# Patient Record
Sex: Male | Born: 1935 | Race: White | Hispanic: No | Smoking: Former smoker
Health system: Southern US, Community
[De-identification: ages and names within clinical notes are randomized; demographics above are authoritative.]

## PROBLEM LIST (undated history)

## (undated) DIAGNOSIS — M1990 Unspecified osteoarthritis, unspecified site: Secondary | ICD-10-CM

## (undated) DIAGNOSIS — F0390 Unspecified dementia without behavioral disturbance: Secondary | ICD-10-CM

## (undated) DIAGNOSIS — I10 Essential (primary) hypertension: Secondary | ICD-10-CM

## (undated) DIAGNOSIS — IMO0002 Reserved for concepts with insufficient information to code with codable children: Secondary | ICD-10-CM

## (undated) DIAGNOSIS — F329 Major depressive disorder, single episode, unspecified: Secondary | ICD-10-CM

## (undated) DIAGNOSIS — N400 Enlarged prostate without lower urinary tract symptoms: Secondary | ICD-10-CM

## (undated) DIAGNOSIS — T07XXXA Unspecified multiple injuries, initial encounter: Secondary | ICD-10-CM

## (undated) HISTORY — PX: OTHER SURGICAL HISTORY: SHX169

---

## 2003-03-20 ENCOUNTER — Ambulatory Visit (HOSPITAL_COMMUNITY): Admission: RE | Admit: 2003-03-20 | Discharge: 2003-03-20 | Payer: Self-pay | Admitting: Ophthalmology

## 2006-11-05 ENCOUNTER — Inpatient Hospital Stay (HOSPITAL_COMMUNITY): Admission: EM | Admit: 2006-11-05 | Discharge: 2006-11-11 | Payer: Self-pay | Admitting: Emergency Medicine

## 2006-11-06 ENCOUNTER — Encounter (INDEPENDENT_AMBULATORY_CARE_PROVIDER_SITE_OTHER): Payer: Self-pay | Admitting: Specialist

## 2006-11-06 ENCOUNTER — Ambulatory Visit: Payer: Self-pay | Admitting: Orthopedic Surgery

## 2006-11-11 ENCOUNTER — Inpatient Hospital Stay: Admission: AD | Admit: 2006-11-11 | Discharge: 2006-12-10 | Payer: Self-pay | Admitting: Family Medicine

## 2006-11-24 ENCOUNTER — Ambulatory Visit: Payer: Self-pay | Admitting: Family Medicine

## 2006-11-27 ENCOUNTER — Ambulatory Visit (HOSPITAL_COMMUNITY): Admission: RE | Admit: 2006-11-27 | Discharge: 2006-11-27 | Payer: Self-pay | Admitting: Family Medicine

## 2006-12-10 ENCOUNTER — Ambulatory Visit: Payer: Self-pay | Admitting: Orthopedic Surgery

## 2007-02-11 ENCOUNTER — Ambulatory Visit: Payer: Self-pay | Admitting: Orthopedic Surgery

## 2007-07-07 ENCOUNTER — Emergency Department (HOSPITAL_COMMUNITY): Admission: EM | Admit: 2007-07-07 | Discharge: 2007-07-07 | Payer: Self-pay | Admitting: Emergency Medicine

## 2007-12-23 ENCOUNTER — Encounter: Payer: Self-pay | Admitting: Family Medicine

## 2008-01-25 ENCOUNTER — Ambulatory Visit: Payer: Self-pay | Admitting: Orthopedic Surgery

## 2008-01-25 ENCOUNTER — Inpatient Hospital Stay (HOSPITAL_COMMUNITY): Admission: EM | Admit: 2008-01-25 | Discharge: 2008-01-27 | Payer: Self-pay | Admitting: Emergency Medicine

## 2008-01-26 ENCOUNTER — Encounter: Payer: Self-pay | Admitting: Orthopedic Surgery

## 2008-01-27 ENCOUNTER — Encounter: Payer: Self-pay | Admitting: Orthopedic Surgery

## 2008-02-02 ENCOUNTER — Telehealth: Payer: Self-pay | Admitting: Orthopedic Surgery

## 2008-02-02 ENCOUNTER — Encounter: Payer: Self-pay | Admitting: Orthopedic Surgery

## 2008-04-07 ENCOUNTER — Ambulatory Visit: Payer: Self-pay | Admitting: Orthopedic Surgery

## 2008-04-07 ENCOUNTER — Inpatient Hospital Stay (HOSPITAL_COMMUNITY): Admission: EM | Admit: 2008-04-07 | Discharge: 2008-04-13 | Payer: Self-pay | Admitting: Emergency Medicine

## 2008-04-08 ENCOUNTER — Encounter: Payer: Self-pay | Admitting: Orthopedic Surgery

## 2008-04-09 ENCOUNTER — Encounter: Payer: Self-pay | Admitting: Orthopedic Surgery

## 2008-04-13 ENCOUNTER — Encounter: Payer: Self-pay | Admitting: Orthopedic Surgery

## 2008-04-13 ENCOUNTER — Inpatient Hospital Stay: Admission: AD | Admit: 2008-04-13 | Discharge: 2008-05-19 | Payer: Self-pay | Admitting: Internal Medicine

## 2008-05-10 ENCOUNTER — Ambulatory Visit: Payer: Self-pay | Admitting: Orthopedic Surgery

## 2008-05-10 DIAGNOSIS — S72143A Displaced intertrochanteric fracture of unspecified femur, initial encounter for closed fracture: Secondary | ICD-10-CM

## 2008-07-17 ENCOUNTER — Ambulatory Visit: Payer: Self-pay | Admitting: Orthopedic Surgery

## 2008-07-17 DIAGNOSIS — M76899 Other specified enthesopathies of unspecified lower limb, excluding foot: Secondary | ICD-10-CM

## 2009-02-08 IMAGING — CR DG HIP (WITH OR WITHOUT PELVIS) 2-3V*L*
3 series · 3 of 3 positions shown · non-contrast
Comparison: None.

CLINICAL DATA: 89-year-old male with fall.

LEFT HIP - COMPLETE 2+ VIEW

[view not recorded (1 of 3)]
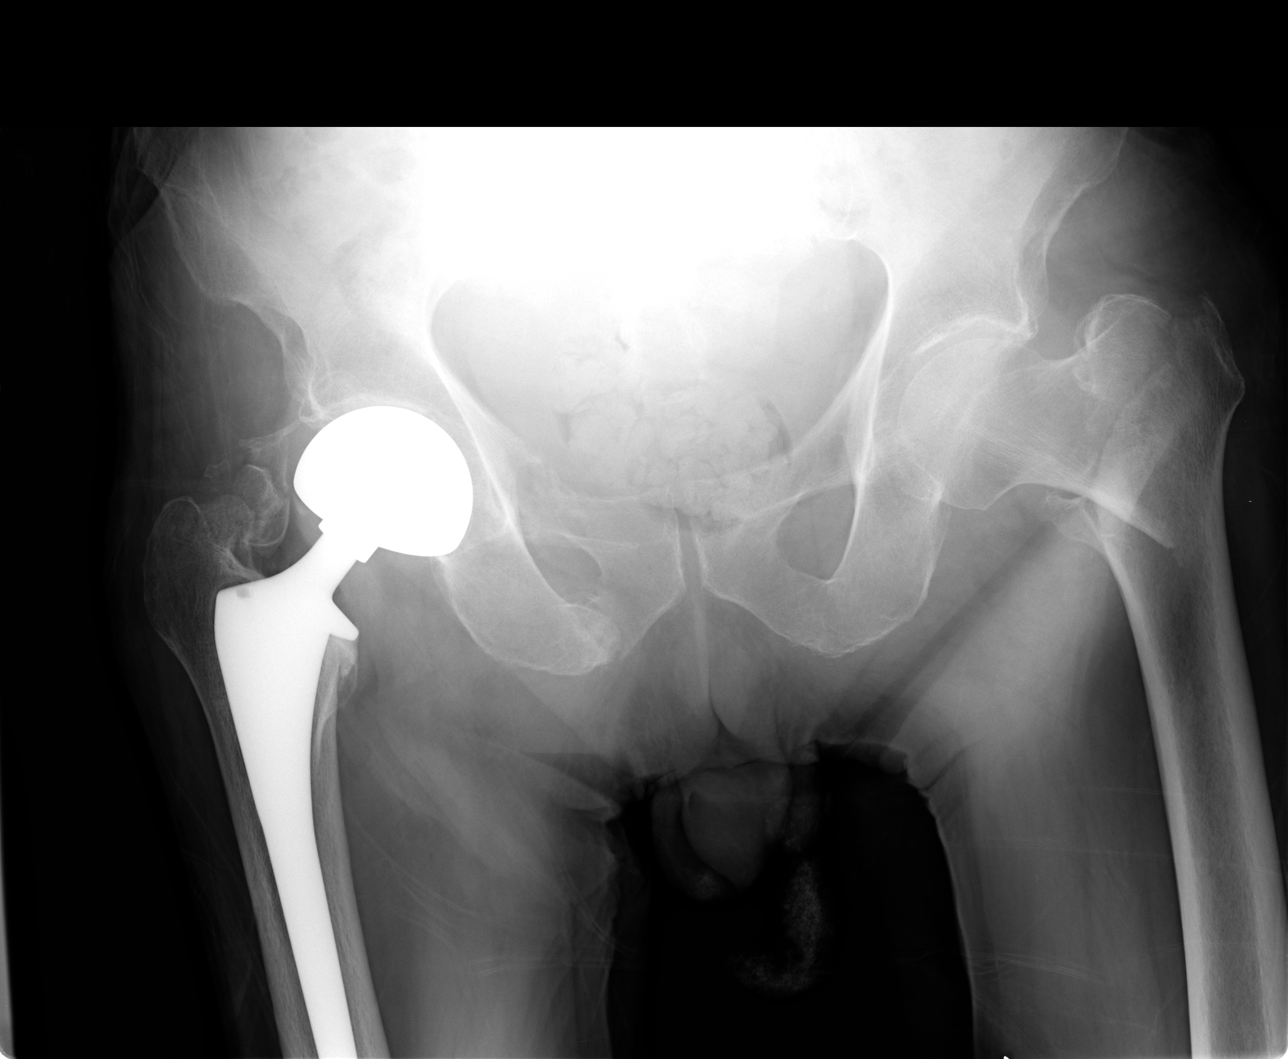

[view not recorded (2 of 3)]
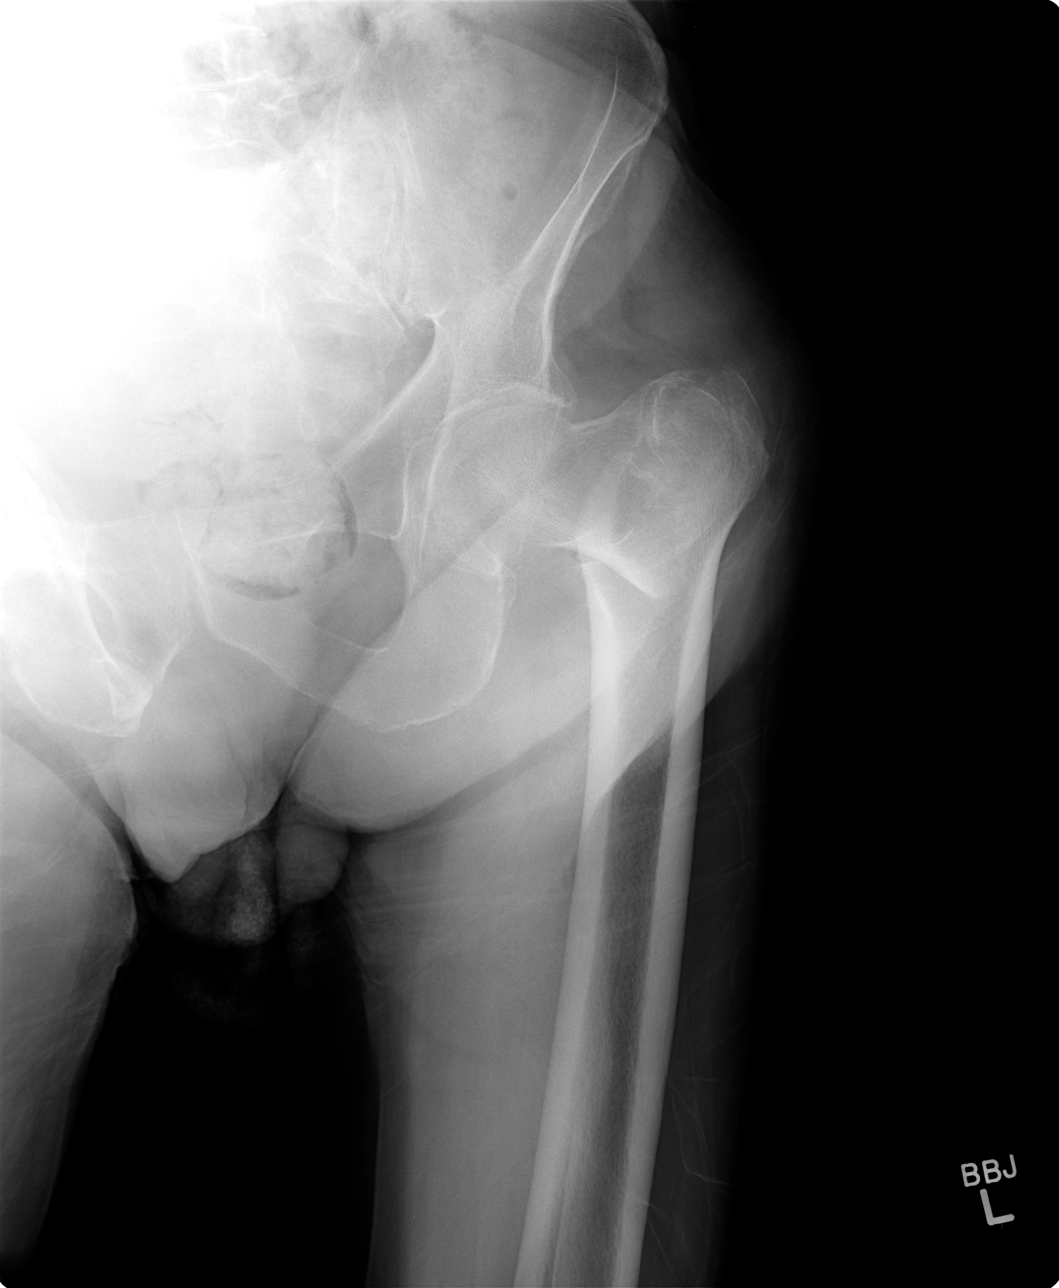

[view not recorded (3 of 3)]
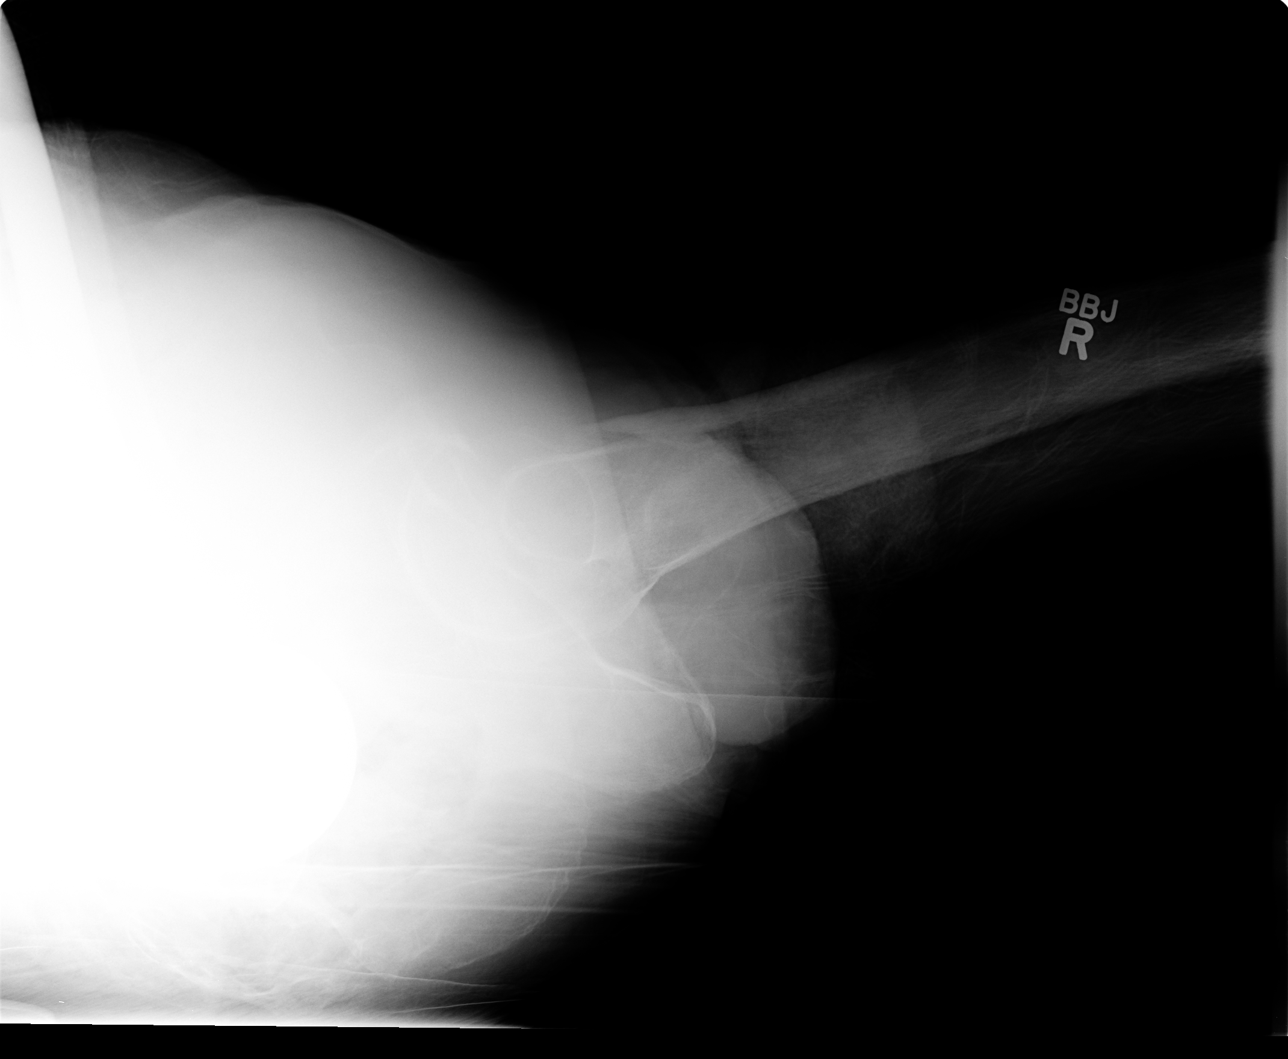

[3 of 3 positions shown; findings below may reference images not displayed]

FINDINGS: Sequelae of right femoral arthroplasty.  Heterotopic
ossification about the right greater trochanter.

Comminuted fracture of the proximal left femur, intertrochanteric.
Varus impaction.  The lesser trochanter might constitute a separate
comminution fragments and is proximally displaced. Mild anterior
displacement of the distal fragment on cross-table lateral view.

No associated fracture of the left hemi pelvis identified.   There
is irregularity of the right superior and inferior pubic rami
compatible with mildly displaced age indeterminate fractures.
These may be chronic given the postoperative changes to the
proximal right femur.
IMPRESSION: 1.  Comminuted intertrochanteric left femoral fracture with varus
impaction and mild anterior displacement.  The lesser trochanter
might constitute a separate comminution fragment.
2.  Age indeterminate but favor chronic right superior inferior
pubic rami fractures.

## 2009-02-08 IMAGING — CR DG CHEST 1V
1 series · 1 of 1 positions shown · non-contrast
Comparison: 11/05/2006

CLINICAL DATA: 89-year-old male with fall, preoperative exam for
left hip fracture repair.

CHEST - 1 VIEW

[view not recorded]
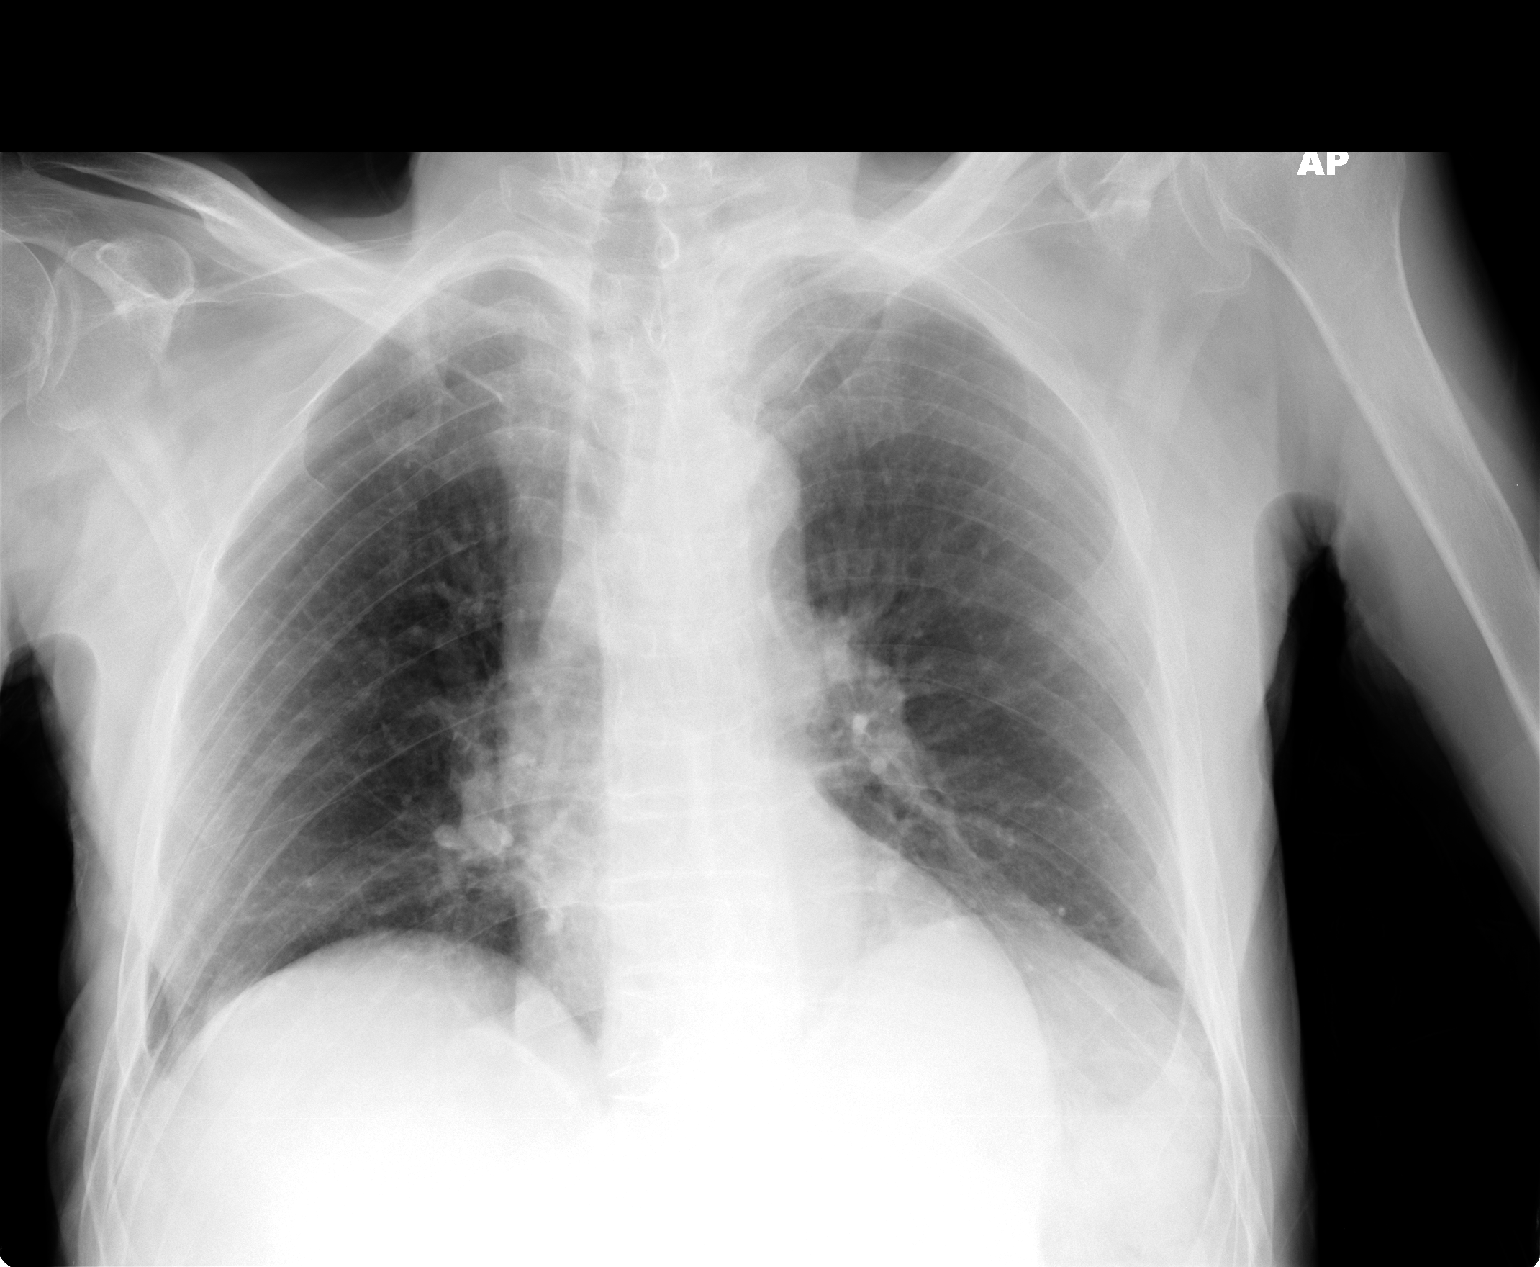

[1 of 1 positions shown; findings below may reference images not displayed]

FINDINGS: Shallow lower lung volumes, but stable cardiac size
mediastinal contour.  No pneumothorax, pulmonary edema, pleural
effusion or focal airspace opacity.  Stable visualized osseous
structures.
IMPRESSION: Shallow lung volumes.  No acute cardiopulmonary abnormality.

## 2009-09-28 ENCOUNTER — Ambulatory Visit (HOSPITAL_COMMUNITY): Admission: RE | Admit: 2009-09-28 | Discharge: 2009-09-28 | Payer: Self-pay | Admitting: Family Medicine

## 2011-01-21 NOTE — Letter (Signed)
Summary: rma chart  rma chart   Imported By: Curtis Sites 08/09/2010 11:32:44  _____________________________________________________________________  External Attachment:    Type:   Image     Comment:   External Document

## 2011-02-02 ENCOUNTER — Emergency Department (HOSPITAL_COMMUNITY)
Admission: EM | Admit: 2011-02-02 | Discharge: 2011-02-02 | Disposition: A | Discharge status: Home / Self Care | Payer: Medicare Other | Attending: Emergency Medicine | Admitting: Emergency Medicine

## 2011-02-02 ENCOUNTER — Emergency Department (HOSPITAL_COMMUNITY): Payer: Medicare Other

## 2011-02-02 DIAGNOSIS — I10 Essential (primary) hypertension: Secondary | ICD-10-CM | POA: Insufficient documentation

## 2011-02-02 DIAGNOSIS — W1809XA Striking against other object with subsequent fall, initial encounter: Secondary | ICD-10-CM | POA: Insufficient documentation

## 2011-02-02 DIAGNOSIS — S0990XA Unspecified injury of head, initial encounter: Secondary | ICD-10-CM | POA: Insufficient documentation

## 2011-02-02 DIAGNOSIS — Y9210 Unspecified residential institution as the place of occurrence of the external cause: Secondary | ICD-10-CM | POA: Insufficient documentation

## 2011-02-02 DIAGNOSIS — N390 Urinary tract infection, site not specified: Secondary | ICD-10-CM | POA: Insufficient documentation

## 2011-02-02 DIAGNOSIS — F0390 Unspecified dementia without behavioral disturbance: Secondary | ICD-10-CM | POA: Insufficient documentation

## 2011-02-02 LAB — URINALYSIS, ROUTINE W REFLEX MICROSCOPIC
Bilirubin Urine: NEGATIVE
Hgb urine dipstick: NEGATIVE
Nitrite: NEGATIVE
Specific Gravity, Urine: >1.03 — ABNORMAL HIGH (ref 1.005–1.030)
Urine Glucose, Fasting: NEGATIVE mg/dL
pH: 5.5 (ref 5.0–8.0)

## 2011-02-02 LAB — URINE MICROSCOPIC-ADD ON

## 2011-02-04 LAB — URINE CULTURE: Culture  Setup Time: 201202131320

## 2011-03-19 ENCOUNTER — Emergency Department (HOSPITAL_COMMUNITY)
Admission: EM | Admit: 2011-03-19 | Discharge: 2011-03-19 | Disposition: A | Discharge status: Home / Self Care | Payer: Medicare Other | Attending: Emergency Medicine | Admitting: Emergency Medicine

## 2011-03-19 DIAGNOSIS — R300 Dysuria: Secondary | ICD-10-CM | POA: Insufficient documentation

## 2011-03-19 DIAGNOSIS — I10 Essential (primary) hypertension: Secondary | ICD-10-CM | POA: Insufficient documentation

## 2011-03-19 DIAGNOSIS — F0390 Unspecified dementia without behavioral disturbance: Secondary | ICD-10-CM | POA: Insufficient documentation

## 2011-03-19 DIAGNOSIS — M810 Age-related osteoporosis without current pathological fracture: Secondary | ICD-10-CM | POA: Insufficient documentation

## 2011-03-19 DIAGNOSIS — N390 Urinary tract infection, site not specified: Secondary | ICD-10-CM | POA: Insufficient documentation

## 2011-03-19 DIAGNOSIS — M1990 Unspecified osteoarthritis, unspecified site: Secondary | ICD-10-CM | POA: Insufficient documentation

## 2011-03-19 DIAGNOSIS — Z79899 Other long term (current) drug therapy: Secondary | ICD-10-CM | POA: Insufficient documentation

## 2011-03-19 LAB — BASIC METABOLIC PANEL
Calcium: 9.9 mg/dL (ref 8.4–10.5)
Chloride: 107 mEq/L (ref 96–112)
Creatinine, Ser: 1.09 mg/dL (ref 0.4–1.5)
GFR calc Af Amer: >60 mL/min (ref >60)

## 2011-03-19 LAB — CBC
Hemoglobin: 12 g/dL — ABNORMAL LOW (ref 13.0–17.0)
RBC: 3.95 MIL/uL — ABNORMAL LOW (ref 4.22–5.81)
WBC: 6.5 10*3/uL (ref 4.0–10.5)

## 2011-03-19 LAB — DIFFERENTIAL
Basophils Absolute: 0 10*3/uL (ref 0.0–0.1)
Basophils Relative: 1 % (ref 0–1)
Neutro Abs: 4.1 10*3/uL (ref 1.7–7.7)
Neutrophils Relative %: 62 % (ref 43–77)

## 2011-03-19 LAB — URINALYSIS, ROUTINE W REFLEX MICROSCOPIC
Glucose, UA: NEGATIVE mg/dL
Ketones, ur: NEGATIVE mg/dL
pH: 5 (ref 5.0–8.0)

## 2011-03-19 LAB — URINE MICROSCOPIC-ADD ON

## 2011-03-20 LAB — URINE CULTURE
Colony Count: NO GROWTH
Culture: NO GROWTH

## 2011-04-07 ENCOUNTER — Emergency Department (HOSPITAL_COMMUNITY): Payer: Medicare Other

## 2011-04-07 ENCOUNTER — Emergency Department (HOSPITAL_COMMUNITY)
Admission: EM | Admit: 2011-04-07 | Discharge: 2011-04-07 | Disposition: A | Discharge status: Home / Self Care | Payer: Medicare Other | Attending: Emergency Medicine | Admitting: Emergency Medicine

## 2011-04-07 DIAGNOSIS — M129 Arthropathy, unspecified: Secondary | ICD-10-CM | POA: Insufficient documentation

## 2011-04-07 DIAGNOSIS — R059 Cough, unspecified: Secondary | ICD-10-CM | POA: Insufficient documentation

## 2011-04-07 DIAGNOSIS — M810 Age-related osteoporosis without current pathological fracture: Secondary | ICD-10-CM | POA: Insufficient documentation

## 2011-04-07 DIAGNOSIS — R509 Fever, unspecified: Secondary | ICD-10-CM | POA: Insufficient documentation

## 2011-04-07 DIAGNOSIS — F0390 Unspecified dementia without behavioral disturbance: Secondary | ICD-10-CM | POA: Insufficient documentation

## 2011-04-07 DIAGNOSIS — R05 Cough: Secondary | ICD-10-CM | POA: Insufficient documentation

## 2011-04-07 DIAGNOSIS — I10 Essential (primary) hypertension: Secondary | ICD-10-CM | POA: Insufficient documentation

## 2011-04-07 DIAGNOSIS — Z79899 Other long term (current) drug therapy: Secondary | ICD-10-CM | POA: Insufficient documentation

## 2011-04-07 LAB — CBC
HCT: 33.6 % — ABNORMAL LOW (ref 39.0–52.0)
Hemoglobin: 11.5 g/dL — ABNORMAL LOW (ref 13.0–17.0)
MCV: 90.8 fL (ref 78.0–100.0)
RBC: 3.7 MIL/uL — ABNORMAL LOW (ref 4.22–5.81)
RDW: 14.6 % (ref 11.5–15.5)
WBC: 4.9 10*3/uL (ref 4.0–10.5)

## 2011-04-07 LAB — DIFFERENTIAL
Basophils Absolute: 0 10*3/uL (ref 0.0–0.1)
Lymphocytes Relative: 23 % (ref 12–46)
Lymphs Abs: 1.1 10*3/uL (ref 0.7–4.0)
Neutro Abs: 2.9 10*3/uL (ref 1.7–7.7)
Neutrophils Relative %: 58 % (ref 43–77)

## 2011-04-07 LAB — URINALYSIS, ROUTINE W REFLEX MICROSCOPIC
Bilirubin Urine: NEGATIVE
Glucose, UA: NEGATIVE mg/dL
Nitrite: NEGATIVE
Specific Gravity, Urine: 1.03 (ref 1.005–1.030)
pH: 5 (ref 5.0–8.0)

## 2011-04-07 LAB — BASIC METABOLIC PANEL
BUN: 21 mg/dL (ref 6–23)
Chloride: 108 mEq/L (ref 96–112)
GFR calc non Af Amer: 57 mL/min — ABNORMAL LOW (ref >60)
Glucose, Bld: 111 mg/dL — ABNORMAL HIGH (ref 70–99)
Potassium: 4 mEq/L (ref 3.5–5.1)
Sodium: 137 mEq/L (ref 135–145)

## 2011-04-08 ENCOUNTER — Emergency Department (HOSPITAL_COMMUNITY)
Admission: EM | Admit: 2011-04-08 | Discharge: 2011-04-08 | Disposition: A | Discharge status: Home / Self Care | Payer: Medicare Other | Attending: Emergency Medicine | Admitting: Emergency Medicine

## 2011-04-08 ENCOUNTER — Emergency Department (HOSPITAL_COMMUNITY): Payer: Medicare Other

## 2011-04-08 DIAGNOSIS — F0390 Unspecified dementia without behavioral disturbance: Secondary | ICD-10-CM | POA: Insufficient documentation

## 2011-04-08 DIAGNOSIS — R509 Fever, unspecified: Secondary | ICD-10-CM | POA: Insufficient documentation

## 2011-04-08 DIAGNOSIS — M129 Arthropathy, unspecified: Secondary | ICD-10-CM | POA: Insufficient documentation

## 2011-04-08 DIAGNOSIS — J069 Acute upper respiratory infection, unspecified: Secondary | ICD-10-CM | POA: Insufficient documentation

## 2011-04-08 DIAGNOSIS — I10 Essential (primary) hypertension: Secondary | ICD-10-CM | POA: Insufficient documentation

## 2011-04-08 LAB — HEPATIC FUNCTION PANEL
ALT: 18 U/L (ref 0–53)
AST: 20 U/L (ref 0–37)
Alkaline Phosphatase: 80 U/L (ref 39–117)
Bilirubin, Direct: 0.1 mg/dL (ref 0.0–0.3)
Total Bilirubin: 0.6 mg/dL (ref 0.3–1.2)

## 2011-04-08 LAB — URINALYSIS, ROUTINE W REFLEX MICROSCOPIC
Bilirubin Urine: NEGATIVE
Hgb urine dipstick: NEGATIVE
Ketones, ur: NEGATIVE mg/dL
Protein, ur: NEGATIVE mg/dL
Specific Gravity, Urine: >1.03 — ABNORMAL HIGH (ref 1.005–1.030)
Urobilinogen, UA: 0.2 mg/dL (ref 0.0–1.0)

## 2011-04-08 LAB — CBC
HCT: 34.4 % — ABNORMAL LOW (ref 39.0–52.0)
Hemoglobin: 11.6 g/dL — ABNORMAL LOW (ref 13.0–17.0)
MCV: 91 fL (ref 78.0–100.0)
WBC: 3.8 10*3/uL — ABNORMAL LOW (ref 4.0–10.5)

## 2011-04-08 LAB — DIFFERENTIAL
Basophils Absolute: 0 10*3/uL (ref 0.0–0.1)
Lymphocytes Relative: 20 % (ref 12–46)
Lymphs Abs: 0.8 10*3/uL (ref 0.7–4.0)
Neutro Abs: 2.4 10*3/uL (ref 1.7–7.7)

## 2011-04-08 LAB — INFLUENZA PANEL BY PCR (TYPE A & B)

## 2011-04-08 LAB — BASIC METABOLIC PANEL
BUN: 19 mg/dL (ref 6–23)
CO2: 23 mEq/L (ref 19–32)
Chloride: 105 mEq/L (ref 96–112)
Glucose, Bld: 93 mg/dL (ref 70–99)
Potassium: 3.7 mEq/L (ref 3.5–5.1)
Sodium: 135 mEq/L (ref 135–145)

## 2011-05-06 NOTE — Discharge Summary (Signed)
NAMEOSWELL, SAY                ACCOUNT NO.:  [id number]   MEDICAL RECORD NO.:  [id number]          PATIENT TYPE:  INP   LOCATION:  A338                          FACILITY:  [facility]   PHYSICIAN:  Dorris Singh, DO    DATE OF BIRTH:  06/08/1917   DATE OF ADMISSION:  01/25/2008  DATE OF DISCHARGE:  02/05/2009LH                               DISCHARGE SUMMARY   ADMISSION DIAGNOSES:  1. Fractured pelvic rami.  2. Dementia.  3. Recent history of upper respiratory infection.   DISCHARGE DIAGNOSES:  1. Fractured pelvic rami.  2. Dementia.   CONSULTATIONS:  PT, OT and Dr. Romeo Apple of orthopedic surgery.   PRIMARY CARE PHYSICIAN:  Dr. Regino Schultze.   HISTORY OF PRESENT ILLNESS:  His H&P was done by Dr. Rito Ehrlich. To  summarize, this is an 77 year old man who lives by himself who recently  lost his wife, slipped and fell on ice. He was brought into the  emergency room with a complaint of right hip pain. He was found to have  a fractured right superior and inferior pelvic rami with possible  additional fractures to the left medial pubis. He was then admitted to  the service of [facility] and due to the patient not being able to  mobilize himself, he was seen by Dr. Romeo Apple and seen by physical  therapy. He was hypokalemic upon admission which is now  replaced. He  also had some dementia which has remained stable and there was a concern  of a possible head injury in which a head CT was obtained. A head CT was  done without contrast which showed atrophy and chronic small vessel  disease but no acute findings. The patient then continued to improve  without any problems. He continues with physical therapy and also with  his Zithromax. He finished his current course that he was set on prior  to admission and he was also placed on pain management. The patient  continued to do well. He was seen by Dr. Romeo Apple who recommended him  followup in 6 weeks. The patient also was seen on a daily basis  with  physical therapy. At this point in time, the patient was stable. Today  his blood work is within normal limits. The patient's family would like  to take him home and due to his wife dying and being buried one day  prior to admission, they would like for him to have time at home after  this and then if they need to put him into a nursing home for  rehabilitation they will consider it at that point in time. Will go  ahead and set up home health which has been done and will go ahead and  discharge him to home. Will send him home on pain medication and his  home medications. His home medications include Xyzal 5 mg daily and  Aleve as needed. Will also send him  home on Darvocet-N 50 mg one every 4-6 p.r.n. pain. He is recommended  followup with Dr. Romeo Apple in 6 weeks and with Dr. Regino Schultze in 1 week and  home health  has been set up with him. He will be on a low heart health  diet and to increase activity slowly as tolerated.      Dorris Singh, DO  Electronically Signed     CB/MEDQ  D:  01/27/2008  T:  01/27/2008  Job:  [id number]

## 2011-05-06 NOTE — Op Note (Signed)
[name], [name]                ACCOUNT NO.:  [id number]   MEDICAL RECORD NO.:  [id number]          PATIENT TYPE:  INP   LOCATION:  IC03                          FACILITY:  [facility]   PHYSICIAN:  Vickki Hearing, M.[name].DATE OF BIRTH:  Oct 27, 1917   DATE OF PROCEDURE:  04/09/2008  DATE OF DISCHARGE:                               OPERATIVE REPORT   PREOPERATIVE DIAGNOSIS:  Fractured left hip - 4-part intertrochanteric  fracture, stable pattern.   POSTOPERATIVE DIAGNOSIS:  Fractured left hip - 4-part intertrochanteric  fracture, stable pattern.   PROCEDURE:  Open treatment internal fixation, left hip.   FIXATION:  Stryker gamma nail.  We used a 110-mm lag screw, a 400-mm  long nail, a 45-mm distal locking screw, and a standard proximal acorn.   SURGEON:  Vickki Hearing, MD.   ASSISTANT:  Cecile Sheerer, RNFA.   ANESTHESIA:  Spinal.   BLOOD LOSS ESTIMATED:  250 mL.   COMPLICATIONS:  None.   COUNTS:  Correct.   OPERATIVE FINDINGS:  There was a 4-part fracture of the left hip, but  there was stable fracture pattern with only a small lesser trochanteric  fragment.  Posteromedial buttress and lateral wall were both intact.   DETAILS OF PROCEDURE:  Felipe Paluch was given 1 g of IV Ancef.  His left  hip was marked as the surgical site and countersigned.  He was taken to  surgery.  He had a routine spinal anesthetic.  He was placed on the  fracture table.  The right leg was placed in a well-leg holder and was  placed in abduction.  The left leg was placed in traction.  Padding was  done as needed on the peroneal post.  His left arm was placed across his  chest.   After sterile prep and drape and manipulation of the fracture with  traction and internal rotation to attain a stable reduction and stable  pattern on x-ray, time-out procedure was completed.   An incision was made over the greater trochanter, extended proximally.  The subcutaneous tissue was divided.  The fascia  was divided in line  with the skin incision.  Blunt dissection was carried out through the  gluteus muscle down to the greater trochanter.  The curved awl was  entered just on the medial side of the trochanter and the anterior  third, and passed through the femoral canal.  This was confirmed on  radiograph.  A long guidewire was placed down to the knee and confirmed  on radiograph.   The measurement for the nail measured 400 mm, and a 125-degree 400-mm  nail was attached to the insertion device.  We did a standard proximal  ream with a large reamer down to the level of the lesser trochanter and  then reamed to 11, 12, and 13 down to the knee to open up the canal.  We  then passed the nail, confirmed its position on radiographs, AP and  lateral.   A small stab incision was made on the lateral thigh.  The cannulated  guide was placed in the 125-degree slot and  advanced to bone.  Radiographs confirmed position of the guidewire which was placed in the  center of the femoral head on the AP view and an acceptable position on  the lateral view with an acceptable tip to apex distance.   This measured 110.  We reamed over the guidewire to 110 and passed the  lag screw.  Guidewire was removed.  The tip was then checked and was  found to be intact.   With the screwdriver still in place, the proximal area of the nail was  irrigated and the acorn was passed and tightened and then reversed a  quarter of return.  The screwdriver was checked, and the acorn was  confirmed to be engaged.   The insertion device was removed.  We then turned our attention to the  distal locking screw.  The C-arm was set in position to lock the distal  portion of the nail.  A stab wound was made.  The drill was placed in  the center of the hole for the distal locking slotted screw hole and  this was passed and confirmed on x-ray.  This measured 45 mm in length,  and a 45-mm distal locking screw was passed.  Radiographs,  AP and  lateral, confirmed its position.  The entire femur was then scanned.  The fracture was reduced.  The hardware was in good position.   We then irrigated the proximal wound, closed in layered fashion with 0  and 2-0 Monocryl.  Two distal stab wounds were closed with 2-0 Monocryl.  We then injected 0.5% Marcaine, 1:200,000 epinephrine, 30 mL total at  the fracture site.   Staples were used to close the skin.  Sterile dressings were applied.   The skin of the left and right legs were then checked and removed from  their respective holding devices.  The patient was then taken to the  recovery room in stable condition.   The patient's vital signs remained stable; however, he did have runs of  bigeminy.  This was checked with a 12-lead EKG.  Hemoglobin was checked,  which was 10.9.  Basic metabolic panel is pending.  Dr. Lilian Kapur has  been consulted as a medical consult.  The patient will be transferred to  the ICU for monitoring.  Consult with cardiology, Dr. Domingo Sep, will be  done tomorrow morning, and the patient will be connected to   Dictation ended at this point.      Vickki Hearing, M.[name].  Electronically Signed     SEH/MEDQ  [name]:  04/09/2008  T:  04/10/2008  Job:  [id number]

## 2011-05-06 NOTE — Discharge Summary (Signed)
NAMEEXODUS, KUTZER                ACCOUNT NO.:  [id number]   MEDICAL RECORD NO.:  [id number]          PATIENT TYPE:  INP   LOCATION:  A211                          FACILITY:  [facility]   PHYSICIAN:  Vickki Hearing, M.D.DATE OF BIRTH:  February 16, 1917   DATE OF ADMISSION:  04/07/2008  DATE OF DISCHARGE:  04/23/2009LH                               DISCHARGE SUMMARY   ADMITTING DIAGNOSES:  Intertrochanteric fracture of the left hip.   DISCHARGE DIAGNOSIS:  Intertrochanteric fracture of the left hip.   SECONDARY DIAGNOSIS:  Heart arrhythmia/bigeminy.   OPERATIVE PROCEDURE:  On April 09, 2008, he underwent open treatment,  internal fixation of the left hip with a long Striker gamma nail, 110-mm  lag screw 400 mm length intramedullary nail, 45 mm distal locking screw  and a standard proximal acorn.   SURGEON:  Vickki Hearing, M.D.   ANESTHESIA:  Spinal.   BLOOD LOSS:  250 mL.   There are no complications.   OPERATIVE FINDINGS:  This was a stable four-part intertrochanteric  fracture of the left hip with an intact posteromedial buttress and  lateral wall.   MEDICAL HISTORY:  An 77 year old male, status post right hip  hemiarthroplasty, status post two surgeries on the wrists bilateral for  arthritis.  He had a fractured left pelvis of 3 months ago.  He has  progressing dementia.  He basically stood up without his walker and fell  and fractured his left hip.  Date of injury was April 07, 2008.   COMORBIDITIES:  There were none.   ADMISSION MEDICATIONS:  1. Aleve 1 q.8 p.r.n.  2. Azithromycin 250 mg once daily.  3. Darvocet N 100 three times day.  4. Multivitamin once a day.  5. Flomax 0.4 mg once a day.  6. Celexa 20 mg once daily.  7. Milk of Magnesia 1 tablespoon p.r.n.  8. Xyzal 5 mg daily.   HOSPITAL COURSE:  The patient was admitted as stated on April 07, 2008.  After medical workup, he was taken to surgery and operated on, on April 09, 2008.  He had internal  fixation.  In the postoperative period prior  to closing, the patient developed bigeminy.  This persisted through the  PACU.  A medical consult was obtained.  The patient was sent to the ICU.  He had a cardiology consult the next day and he was placed on metoprolol  12.5 mg b.i.d.  He essentially had no other problems other than the  related changes related to dementia with sundowning.  His laboratory  results remained stable.  Our last recorded labs for him on April 13, 2008, he had a potassium of 3.7, sodium was 134, chloride 103, CO2 of  26, BUN/creatinine 18 and 0.84, glucose 109.  CBC showed a hemoglobin of  10.   Other laboratory studies throughout his hospital course included thyroid  stimulating hormone which was normal at 3.966, done on April 11, 2008.  He had magnesium on April 11, 2008, which showed a level of 2.2.  He had  cardiac panel which was normal x3 to workup  his arrhythmia   On April 12, 2008, his Foley catheter was removed.  On April 12, 2008,  in the early morning, he had urinary retention and the Foley catheter  was placed back in place.  We also had an episode of hematuria during  the postoperative course and a urology consult was obtained.  Recommendations from neurologist, Dr. Rito Ehrlich was to continue the Foley  catheter, continue the Flomax, remove the Foley catheter in the postop  period and return to his office in 1 month.  Benign prostatic  hypertrophy was diagnosed with bladder neck obstruction, prostatism and  hematuria secondary to Foley catheter insertion.   On the date of discharge, vital signs: Temperature 98.2, pulse 76,  respiratory rate 20, blood pressure 114/73, O2 sat on room air 98%.   The patient has had a stable wound which has been clean, dry and intact  with minimal drainage.   WEIGHTBEARING STATUS:  Weightbearing as tolerated.  No hip precautions.   WOUND CARE:  Dressings as needed.  Remove staples on April 19, 2008.   FOLLOW-UP  VISITS:  The patient should follow up in the office on May 09, 2008.  Please call [phone] to get an appointment time.  Please bring  walker with the patient.  On May 09, 2008, a radiograph will be taken.   MEDICATION LIST:  1. Calcium carbonate 1 p.o. b.i.d.  2. Celexa 10 mg p.o. daily.  3. Colace 100 mg p.o. b.i.d.  4. Lovenox 30 mg q.24 for 25 days.  5. Ensure 1 p.o. t.i.d. WC.  6. Metoprolol 12.5 mg p.o. b.i.d.  7. Multivitamin 1 p.o. daily.  8. Protonix 40 mg p.o. daily WC.  9. K-Dur 20 mEq p.o. daily.  10.Darvocet 1 p.o. q.6 h., p.r.n. pain.  11.Flomax 0.4 mg p.o. daily.  12.Tylenol 1-2 regular strength tablets q.4 p.r.n. pain, fever or      headache.  13.Robaxin 500 mg p.o. q.6 p.r.n. for muscle spasms.      Vickki Hearing, M.D.  Electronically Signed     SEH/MEDQ  D:  04/13/2008  T:  04/13/2008  Job:  [id number]   cc:   Kirk Ruths, M.D.  Fax: [phone]

## 2011-05-06 NOTE — Group Therapy Note (Signed)
[name], [name]                ACCOUNT NO.:  [id number]   MEDICAL RECORD NO.:  [id number]          PATIENT TYPE:  INP   LOCATION:  A338                          FACILITY:  [facility]   PHYSICIAN:  Dorris Singh, DO    DATE OF BIRTH:  Jan 22, 1917   DATE OF PROCEDURE:  01/26/2008  DATE OF DISCHARGE:                                 PROGRESS NOTE   The patient was seen today resting comfortably in bed.  She did not have  any complaints.  PT saw him.  The patient actually walked the halls.  Had good stability with a walker.  Orthopedics saw him.  Dr. Romeo [name]  wants to set up an appointment with him in 6 weeks for follow up and x-  rays.  At this point in time, the daughter is asking to take the patient  home because his wife just died less than 2-3 days ago, and they buried  her on Sunday and then he fell, so they would like to take him home to  help with the grieving process, and then if they need to put him in a  skilled nursing unit, they will be able to do that.   PHYSICAL EXAMINATION:  CURRENT VITALS:  Temperature 97.8, pulse 75,  respirations 20, blood pressure 138/89.  GENERAL:  This is an 77 year old Caucasian male who is in no acute  distress.  He is well-developed, well-nourished.  HEART:  Regular rate and rhythm.  LUNGS:  Clear to auscultation bilaterally.  ABDOMEN:  Soft, nontender, nondistended.  EXTREMITIES:  Positive pulses.  No ecchymosis or edema noted.   LABORATORY DATA:  There was no CBC done, but a chemistry is within  normal limits except for an elevated glucose of 135.   ASSESSMENT AND PLAN:  1. Pelvic fracture.  The patient will follow up in 6 weeks.  Will      continue with PT.  2. History of dementia, possibly Alzheimer's type.  3. Recent upper respiratory infection.  The patient does not seem to      be having any residual from that.   PLAN:  1. Talk about discharge planning, if not today then tomorrow.  2. Will set him up with an appointment with Dr.  Romeo [name] in 6 weeks      and will continue to monitor the patient and change the therapy as      needed.      Dorris Singh, DO  Electronically Signed    CB/MEDQ  D:  01/26/2008  T:  01/26/2008  Job:  [id number]

## 2011-05-06 NOTE — Consult Note (Signed)
[name], [name]                ACCOUNT NO.:  [id number]   MEDICAL RECORD NO.:  [id number]          PATIENT TYPE:  INP   LOCATION:  A327                          FACILITY:  [facility]   PHYSICIAN:  Dennie Maizes, M.D.   DATE OF BIRTH:  [birth year]   DATE OF CONSULTATION:  04/08/2008  DATE OF DISCHARGE:                                 CONSULTATION   CONSULTING PHYSICIAN:  Dennie Maizes, M.D., urology.   REASON FOR CONSULTATION:  Urinary frequency, hematuria after Foley  catheter placement, enlarged prostate.   HISTORY OF PRESENT ILLNESS:  This 77 year old male has a history of  falls.  He resides in a nursing home.  He experienced left hip pain  after a fall and he was brought to the emergency room.  He had a left  hip fracture and he has been admitted to hospital by Dr. Romeo [name] for  surgery.   The patient has symptoms of history of prostate obstruction for several  months.  He has slow urinary stream, urinary frequency x4-5, nocturia  x3.  He has been seen by Dr. Regino Schultze and started on Flomax about 3 weeks  ago.  After the hospitalization, a Foley catheter has been inserted.  The patient was noted to have mild hematuria after the Foley catheter  insertion.  The admission urinalysis all is negative.  He has not had  any hematuria in the past.   PAST MEDICAL HISTORY:  1. History of hypertension.  2. Degenerative arthritis.  3. Osteoporosis.  4. Status post hip replacement.   MEDICATIONS:  1. Aleve p.r.n.  2. Azithromycin 250 mg one p.o. daily.  3. Darvocet-N 100 one p.o. t.i.d.  4. Multivitamins one p.o. daily.  5. Flomax 0.4 mg p.o. daily.  6. Celexa 20 mg one p.o. daily.  7. Milk of magnesia p.r.n.   ALLERGIES:  None.   EXAMINATION:  ABDOMEN:  Soft.  No palpable flank mass or CVA tenderness.  Bladder is not palpable.  Foley catheter is draining clear urine at  present.  Testes are normal.  RECTAL EXAMINATION:  Not done as the patient had a fracture recently   IMPRESSION:  Benign prostatic hypertrophy with bladder neck obstruction,  prostatism, hematuria secondary to Foley catheter insertion.   PLAN:  1. Would continue Foley catheter drainage.  2. Continue Flomax.  3. The Foley catheter can be removed in the postoperative period.  4. Return to the office for follow-up in 1 month.   Thanks for this consult.      Dennie Maizes, M.D.  Electronically Signed     SK/MEDQ  D:  04/08/2008  T:  04/08/2008  Job:  [id number]   cc:   Vickki Hearing, M.D.  Fax: [phone]   Kirk Ruths, M.D.  Fax: [phone]

## 2011-05-06 NOTE — H&P (Signed)
NAMEGWEN, [name]                ACCOUNT NO.:  [id number]   MEDICAL RECORD NO.:  [id number]          PATIENT TYPE:  INP   LOCATION:  A338                          FACILITY:  [facility]   PHYSICIAN:  Osvaldo Shipper, MD     DATE OF BIRTH:  Dec 09, 1917   DATE OF ADMISSION:  01/25/2008  DATE OF DISCHARGE:  LH                              HISTORY & PHYSICAL   PRIMARY CARE DOCTOR:  Kirk Ruths, M.D.   ADMITTING DIAGNOSS:  1. Fall resulting in pubic rami fracture.  2. History of dementia, possibly Alzheimer's type.  3. History of recent upper respiratory tract infection.   CHIEF COMPLAINT:  Pain in the hip.   HISTORY OF PRESENT ILLNESS:  The patient is an 77 year old Caucasian  male who lives at home by himself.  His wife passed away recently after  complications of CHF.  The patient was going outside his home today,  when he slipped on ice and fell.  He crawled back into the house and  called EMS.  The patient was brought into the ED, and he was complaining  of pain in his right hip area.  The patient has a history of hip  replacement on the right side.  He underwent x-rays of his hip, which  showed fractures of the right superior and inferior pubic rami with  possible additional fractures of the medial left pubis.  There was a  lucency surrounding the marrow component of the right hip prosthesis  with question regarding loosening versus infection.   The patient was unable to be mobilized in the ED because of pain; and so  he required acute observation in the hospital. No syncopal episode was  reported.   MEDICATIONS AT HOME:  1. He is on a Z-Pak, of which he has completed three days.  2. He is also on an antihistamine agent.   ALLERGIES:  No known drug allergies.   PAST MEDICAL HISTORY SIGNIFICANT FOR:  1. Dementia, diagnosed a few years ago for which he has refused to      take any medications.  2. He had the right hip replacement a few years ago as well.  3. He has had  cataract surgeries in the past. Otherwise, he is a      fairly healthy person.   SOCIAL HISTORY:  He currently lives alone, since his wife passed away a  few days ago.  He denies any smoking, use, alcohol use, or drug use.  He  does have a walker at home, but he does not use it consistently.   FAMILY HISTORY:  Noncontributory.   REVIEW OF SYSTEMS:  Unable to do because of his dementia.   PHYSICAL EXAMINATION:  VITAL SIGNS:  He is afebrile.  Heart rate 75,  respiratory rate 18, blood pressure 128/70, saturation is 100%.  NECK:  Soft and supple.  No thyromegaly is appreciated.  HEENT:  There is bruising over the back of the head.  GENERAL:  Otherwise, he is a generally alert person.  He does not appear  to be in any distress, and is pleasantly  confused.  LUNGS:  Clear to auscultation anteriorly.  CARDIAC:  Exam is unremarkable.  ABDOMEN:  Soft, nontender, nondistended.  Bowel sounds are present.  No  masses or organomegaly is appreciated.  EXTREMITIES:  Show no edema.  Peripheral pulses were palpable.  We did  not try to mobilize the extremities at this time.   LABS:  He has mild anemia with a hemoglobin of 11.3, white count is  normal, platelet count is normal.  He has a potassium of 3.4, glucose is  123.  X-rays as discussed earlier.  He has not had a CT of his head.   ASSESSMENT:  This is an 77 year old Caucasian male with dementia, who  slipped on the ice this morning and fell resulting in a pubic rami  fracture.  He lives alone, he is unable to mobilize himself, and so he  has to be admitted for safety and to proceed with placement.   1. Pubic rami fracture. Goal is pain control. Dr. Romeo Apple is seeing      him, and he will have physical therapy, starting tomorrow.  Then      social services will be involved to consider placement of this      individual.  2. Hypokalemia.  We will replace it.  3. Dementia.  Continue to follow.  4. Head injury.  We will get a CT of the head to  make sure that there      is no intracranial bleeding.  5. Anemia.  This is mild and there is no evidence of any bleeding.   Anticipate that the patient will be able to go to a nursing home or  rehabilitation center in the next day or two.      Osvaldo Shipper, MD  Electronically Signed     GK/MEDQ  D:  01/25/2008  T:  01/25/2008  Job:  [id number]   cc:   Kirk Ruths, M.D.  Fax: [phone]   Vickki Hearing, M.D.  Fax: [phone]

## 2011-05-06 NOTE — H&P (Signed)
[name], [name]                ACCOUNT NO.:  [id number]   MEDICAL RECORD NO.:  [id number]          PATIENT TYPE:  INP   LOCATION:  IC03                          FACILITY:  [facility]   PHYSICIAN:  Vickki Hearing, M.D.DATE OF BIRTH:  06/13/1917   DATE OF ADMISSION:  04/07/2008  DATE OF DISCHARGE:  LH                              HISTORY & PHYSICAL   CHIEF COMPLAINT:  Left hip pain.   He is 77 years old, status post right hip hemiarthroplasty. He has had 2  wrist surgeries for arthritis. He has fractured his left pelvis. He  presents after falling and injuring his left hip. He has a 3 to 4 part  intertrochanteric fracture of the left hip. He was admitted in stable  condition.   REVIEW OF SYSTEMS:  His dementia has been progressing.   SOCIAL HISTORY:  No alcohol, history of tobacco use. He now lives at  [facility]. He is independently ambulatory with a walker.   FAMILY HISTORY:  Noncontributory. Daughter is very helpful and involved  in his care.   PHYSICAL EXAMINATION:  VITAL SIGNS:  On admission, his vital signs are  stable.  GENERAL:  His appearance was thin, normal development, grooming and  hygiene, poor dentition.  HEENT:  Normal.  NECK:  Supple, no evidence of lymphadenopathy.  CARDIAC/CARDIOVASCULAR:  Normal peripheral pulses, no peripheral edema,  swelling or tenderness.  LUNGS:  Clear.  ABDOMEN:  Soft, nontender, nondistended.  NEUROLOGIC:  Shows normal mental status and mood with mild confusion.  SKIN:  Intact with no rash, lesions, ulcerations, pustules or pimple-  like lesions.  EXTREMITIES:  The upper extremities showed good grip strength. Normal  range of motion without contracture, subluxation, atrophy or tremor.  There were no soft tissue masses. The left lower extremity had pain with  internal rotation, tenderness over the left greater trochanter, manual  muscle testing was deferred. There were no deformities. He was in  traction. His right leg was  normal.   Radiographs shows a 4-part intertrochanteric fracture of the left hip  with a stable posterior medial buttress.   PLAN:  Internal fixation with intramedullary nailing using gamma nail  with screw fixation into the femoral neck and head.   Dictation ended at this point.      Vickki Hearing, M.D.  Electronically Signed     SEH/MEDQ  D:  04/10/2008  T:  04/10/2008  Job:  [id number]

## 2011-05-09 NOTE — Group Therapy Note (Signed)
NAMESHI, BLANKENSHIP                ACCOUNT NO.:  [id number]   MEDICAL RECORD NO.:  [id number]          PATIENT TYPE:  INP   LOCATION:  A327                          FACILITY:  [facility]   PHYSICIAN:  Vickki Hearing, M.D.DATE OF BIRTH:  [birth year]   DATE OF PROCEDURE:  11/08/2006  DATE OF DISCHARGE:                                   PROGRESS NOTE   He is status post a right bipolar hemiarthroplasty.   EXAM:  EXTREMITIES:  His drain is out.  His wound looks great.  He has no  peripheral edema.  Calf is supple.  GENERAL:  He is awake and alert.  He is oriented, he is hungry, has minimal  pain.  VITAL SIGNS:  Temperature is 99.1.  Blood pressure was stable.  O2 sat 94%.  Hemoglobin was 9.8 after a unit. Sodium is 130, potassium is 4.0.   PLAN:  Advance to regular diet.  Continued therapy.  Social Service consult  tomorrow to start discharge plan.      Vickki Hearing, M.D.  Electronically Signed     SEH/MEDQ  D:  11/08/2006  T:  11/08/2006  Job:  [id number]

## 2011-05-09 NOTE — H&P (Signed)
[name], [name]                ACCOUNT NO.:  [id number]   MEDICAL RECORD NO.:  [id number]          PATIENT TYPE:  INP   LOCATION:  A327                          FACILITY:  [facility]   PHYSICIAN:  Vickki Hearing, M.D.DATE OF BIRTH:  1917/01/07   DATE OF ADMISSION:  11/05/2006  DATE OF DISCHARGE:  LH                                HISTORY & PHYSICAL   A handwritten history and physical was done to expedite admission.  This is  just a dictated copy to complete the medical record.  Dr. Geanie Logan record  is actually a consultation.   CHIEF COMPLAINT:  Right hip pain.   HISTORY:  This is an 77 year old male who has no previous medical problems,  has had two wrist surgeries for arthritis and takes no medication other  than occasional Aleve.  His feet were tangled and he fell at home.  He  complains of severe nonradiating sharp right hip pain with leg deformity.   REVIEW OF SYSTEMS:  Review of systems was completely negative for all 10  systems.   SOCIAL HISTORY:  No alcohol use.  There is some mild tobacco use.  He lives  with his wife but she has a prosthesis and is not able to help him very  much.  His daughter is very supportive.   FAMILY HISTORY:  Noncontributory.   EXAMINATION:  VITAL SIGNS:  On admission to the hospital the patient's vital  signs were stable.  See ER record.  GENERAL APPEARANCE:  The patient was thin, well developed, well nourished.  Grooming and hygiene were normal.  He had poor dentition.  HEENT:  Otherwise normal.  NECK:  Supple.  No evidence of lymphadenopathy.  CARDIAC AND CARDIOVASCULAR:  Exam showed normal peripheral pulses.  No  edema, swelling or tenderness.  LUNGS:  Clear.  ABDOMEN:  Soft, nontender, nondistended.  Normal bowel sounds.  NEUROLOGIC:  Exam showed normal mental status and mood.  SKIN:  No evidence of rash, lesion or ulceration, no pustules or pimple-like  lesions.  UPPER EXTREMITIES:  The upper extremities had excellent  strength, range of  motion, there were abnormalities over the ulnas from previous surgery and  there were soft tissue masses there which appeared to be gouty in nature but  unconfirmed.  LOWER EXTREMITIES:  The left lower extremity hip, knee and ankle motion  normal.  Strength in muscles to manual testing 5/5.  No deformity was seen,  no tenderness or joint contracture.   The right leg was externally rotated.  Any attempts at motion caused pain  and further evaluation other than neurovascular was deferred for obvious  reasons.   IMPRESSION:  Radiographs show a completely displaced femoral neck fracture  of the right hip and the recommendations are for a medical consultation with  Dr. Nobie Putnam and then proceed with bipolar hemiarthroplasty right hip.   I have spoken with the daughter with the patient present and reviewed the  risks and benefits of the procedure, alternative procedures and treatment,  explained to them that essentially standard of care in a functional patient  is to replace  the hip.  We discussed DVT, pulmonary embolus, infection,  dislocation, rehab.   They agree to proceed with surgery and will sign informed consent.      Vickki Hearing, M.D.  Electronically Signed     SEH/MEDQ  D:  11/07/2006  T:  11/07/2006  Job:  [id number]

## 2011-05-09 NOTE — Op Note (Signed)
NAMEDELTA, DESHMUKH                ACCOUNT NO.:  [id number]   MEDICAL RECORD NO.:  [id number]          PATIENT TYPE:  INP   LOCATION:  A327                          FACILITY:  [facility]   PHYSICIAN:  Vickki Hearing, M.D.DATE OF BIRTH:  08/28/1917   DATE OF PROCEDURE:  DATE OF DISCHARGE:                                 OPERATIVE REPORT   CHIEF COMPLAINT:  Pain right hip.   HISTORY:  An 77 year old male fell at home and fractured his right hip.  He  sustained a complete displaced fracture of his right femoral neck on  November 05, 2006.  After medical evaluation, he was cleared for surgery,  and he is brought to the operating room for a right hip bipolar  hemiarthroplasty.   SURGEON:  Dr. Romeo Apple.   PREOPERATIVE DIAGNOSIS:  Right femoral neck fracture.   POSTOPERATIVE DIAGNOSIS:  Right femoral neck fracture.   PROCEDURE:  Right bipolar hemiarthroplasty.   IMPLANTS:  We used a DePuy 54 head, 6 stem, press-fit technique, used a 15.5  neck.   ESTIMATED BLOOD LOSS:  Was 200 mL.   FINDINGS:  Relatively normal acetabulum, completely displaced fracture of  the femoral neck.   Mr. Alverio was identified in the preoperative holding area.  He marked his  right leg and hip for surgery.  It was countersigned by me, the surgeon.  His history and physical was reviewed, updated, and he was given antibiotics  and taken to the operating room for a spinal anesthetic.  He was then placed  left side down/right side up.  Axillary roll was inserted.  Padding was  placed on his lower extremities.  He was placed in a hip positioner.   Again, with the right side up, his right leg was prepped and draped using  sterile technique.  Time-out procedure was completed.  The procedure  confirmed as stated.  Antibiotics confirmed to be started within an hour  skin incision.   Straight incision was made over the greater trochanter with the leg slightly  flexed.  The subcutaneous tissue was divided until  the fascia was exposed.  The fascia was split in line with the skin incision and retracted anteriorly  and posteriorly.  The abductor mechanism was peeled from the greater  trochanter in continuity with the vastus lateralis.  Subperiosteal  dissection exposed the proximal femur.  Continued sharp and blunt dissection  exposed the hip capsule.  A capsulotomy was performed, and the capsule was  preserved.  The hip joint was suctioned.  The femoral head was removed.  The  hip was dislocated anteriorly.  The acetabulum was irrigated, inspected and  found to be relatively normal.   A box osteotome was used to lateralize the entry point of the femoral canal.  A provisional neck cut was made, and then a neck cut was made using a neck  cutting guide.  The starter reamer was passed by hand, and then broaches  were sequentially passed up to a size 6.   With a size 6 stem in place, a 15.5 neck and 54 head, a radiograph was  obtained (trials were performed with +5, +8.5, +12 and +15.5 prior to  settling on the 15.5).  Radiographs confirmed adequate stem placement in  excellent neutral alignment, so we did a trial range of motion and found  flexion 90 degrees, internal rotation 45 degrees, extension plus 5 degrees,  external rotation 50 degrees.  Shuck test was good, and on initial reduction  of the hip, good suction sound was noted.   Hip was then dislocated with traction and bone hook.  The wounds were  irrigated.  Acetabulum was cleaned of any bony debris.  The #6 stem was  passed.  The 15.5 neck and 54 head construct was also passed.  Hip motion  was reproduced.  New films were taken, confirming reduction and excellent  alignment of the stem.   The wounds were irrigated again.  The capsule was repaired.  The abductors  were repaired in continuity with several sutures passing through the greater  trochanter.  The fascia was repaired  With #1 Bralon, subcu with zero running Monocryl.  Drain was  placed in the  subcu space.  Thirty mL of Marcaine was injected around the hip.  The  patient's dressing was applied. He was placed on the bed where leg length  checks were made, and they were found to be equal, and we placed an  abduction pillow and took the patient to recovery room in stable condition.   We will use Lovenox for DVT prevention along with early mobilization and  physical therapy.  He can weight bear as tolerated with a walker.  He is to  use abduction pillow for 6 weeks, and he should be on Lovenox for 21-28  days.      Vickki Hearing, M.D.  Electronically Signed     SEH/MEDQ  D:  11/06/2006  T:  11/06/2006  Job:  [id number]

## 2011-05-09 NOTE — H&P (Signed)
NAME:  [name], [name] NO.:  [id number]   MEDICAL RECORD NO.:  [id number]          PATIENT TYPE:  INP   LOCATION:  A327                          FACILITY:  [facility]   PHYSICIAN:  Patrica Duel, M.D.    DATE OF BIRTH:  01-Sep-1917   DATE OF ADMISSION:  11/05/2006  DATE OF DISCHARGE:  LH                              HISTORY & PHYSICAL   CHIEF COMPLAINT:  Hip pain,   HISTORY OF PRESENT ILLNESS:  This is a very healthy 77 year old man with  a completely negative past history and is currently on no medications.   The patient states his feet were tangled up and he fell.  He had no  chest pain or syncope.  He sustained what is described as a right  femoral neck fracture with associated valgus angulation.  He is admitted  for probable surgical intervention, Dr. Romeo Apple being the attending.   The patient specifically denies headache, neurologic deficits, chest  pain, shortness of breath, syncope, palpitations, abdominal pain,  nausea, vomiting, diarrhea, melena, hematemesis, hematochezia or  genitourinary symptoms.   SOCIAL HISTORY:  Significant for occasional cigar which he chews up.  He denies use of alcohol.   PHYSICAL EXAMINATION:  GENERAL:  This is a very pleasant fully alert  male.  VITAL SIGNS:  stable.  He did have a low grade temperature of 100.4.  Upon admission now, he is afebrile.  HEENT: Normocephalic, atraumatic.  Pupils are equal.  Ears, nose and  throat are benign.  NECK:  Supple.  No bruits noted.  CARDIAC:  Heart sounds somewhat distant with no murmurs, rubs or  gallops.  He is somewhat barrel-chested.  LUNGS:  Clear but distant.  ABDOMEN:  Nontender, nondistended.  Bowel sounds are intact.  EXTREMITIES:  No clubbing, cyanosis or edema.  Traction in place on the  right.  NEUROLOGIC:  Nonfocal.  Peripheral pulses are somewhat diminished but  intact.   LABORATORY DATA:  Hemoglobin 10.9 with normal indices, mild left shift  and white count (total 6.3  thousand).  INR is 1.1.  Chemistries are  normal except for mild elevation of glucose 137, alk phos of 129,  albumin 3.2.  EKG is currently pending.   ASSESSMENT:  Right hip fracture, otherwise remarkably healthy male at  age 70.   PLAN:  Routine preop eval will proceed with surgical intervention at Dr.  Mort Sawyers discretion.      Patrica Duel, M.D.  Electronically Signed     MC/MEDQ  D:  11/06/2006  T:  11/06/2006  Job:  [id number]

## 2011-05-09 NOTE — Discharge Summary (Signed)
NAMEALIAS, VILLAGRAN                ACCOUNT NO.:  [id number]   MEDICAL RECORD NO.:  [id number]          PATIENT TYPE:  INP   LOCATION:  A327                          FACILITY:  [facility]   PHYSICIAN:  Vickki Hearing, M.D.DATE OF BIRTH:  05-26-1917   DATE OF ADMISSION:  11/05/2006  DATE OF DISCHARGE:  11/21/2007LH                               DISCHARGE SUMMARY   ADMITTING DIAGNOSIS:  Right femoral neck fracture.   DISCHARGE DIAGNOSIS:  Right femoral neck fracture.   SECONDARY DIAGNOSIS:  Acute blood loss anemia from surgery.   OPERATIVE PROCEDURE:  Bipolar partial hip replacement on the right.   SURGEON:  Vickki Hearing, M.D.   ANESTHETIC:  Spinal.   ESTIMATED BLOOD LOSS:  200 mL.   OPERATIVE FINDINGS:  Completely displaced right femoral neck fracture  with relatively normal acetabulum implants used, DePuy #6 Press-Fit stem  with a 54 bipolar head and a 15.5 neck.  The stem was a [employer] basic  stem.   HISTORY:  This is an 77 year old male with no medical problems, status  post two wrist surgeries 30 years ago for arthritis.  Took no medication  other than Aleve.  His feet became tangled at home, he fell and  fractured his hip.  He came into the hospital on the 15th.  He had a  medical consult.  He was cleared for surgery, underwent bipolar  replacement postoperatively.  He had some mild fevers.  They were  thought to be due to atelectasis and were treated successfully with  incentive spirometer and nebulizer treatments.  He did have a drop in  his hemoglobin.  He was treated with 1 unit of blood for hemoglobin of  9.2.  His hemoglobin on the 20th was 10.1.  He had some mild hypokalemia  which was treated with potassium.  Hemoglobin on November 20 was 3.4.   Physical therapy notes indicate the patient has been ambulatory up to 10  feet with walker with improved gait pattern on the 20th.   On the 21st, he is afebrile.  Vital signs are stable.  He is alert,  awake,  oriented.  Wound looks fine.  Leg lengths are good.   DISCHARGE MEDICATIONS:  1. Lovenox 30 mg subcu q.24 h until December 8, then it can stop.  2. Tylenol 1000 mg q.8 h p.r.n. for pain.  If he has breakthrough      pain, he can have Darvocet 1 up to q.6 h p.r.n. for pain.   The staples should be removed on the 27th.  Steri-Strips can be applied  as needed.  Follow-up appointment should be arranged for 1 month.   PHYSICAL THERAPY INSTRUCTIONS:  Anterior hip precautions.  Gait  weightbearing as tolerated.  Isometric muscle exercises to the hip  muscles and thigh muscles, ankle pumps.   CONDITION:  Improved.   DISPOSITION:  To [facility]      Vickki Hearing, M.D.  Electronically Signed     SEH/MEDQ  D:  11/11/2006  T:  11/11/2006  Job:  [id number]

## 2011-08-29 ENCOUNTER — Emergency Department (HOSPITAL_COMMUNITY): Payer: Medicare Other

## 2011-08-29 ENCOUNTER — Emergency Department (HOSPITAL_COMMUNITY)
Admission: EM | Admit: 2011-08-29 | Discharge: 2011-08-29 | Disposition: A | Discharge status: Hospice | Payer: Medicare Other | Attending: Emergency Medicine | Admitting: Emergency Medicine

## 2011-08-29 ENCOUNTER — Encounter: Payer: Self-pay | Admitting: *Deleted

## 2011-08-29 DIAGNOSIS — M129 Arthropathy, unspecified: Secondary | ICD-10-CM | POA: Insufficient documentation

## 2011-08-29 DIAGNOSIS — I10 Essential (primary) hypertension: Secondary | ICD-10-CM | POA: Insufficient documentation

## 2011-08-29 DIAGNOSIS — W1789XA Other fall from one level to another, initial encounter: Secondary | ICD-10-CM | POA: Insufficient documentation

## 2011-08-29 DIAGNOSIS — M810 Age-related osteoporosis without current pathological fracture: Secondary | ICD-10-CM | POA: Insufficient documentation

## 2011-08-29 DIAGNOSIS — M542 Cervicalgia: Secondary | ICD-10-CM | POA: Insufficient documentation

## 2011-08-29 DIAGNOSIS — Y9210 Unspecified residential institution as the place of occurrence of the external cause: Secondary | ICD-10-CM | POA: Insufficient documentation

## 2011-08-29 DIAGNOSIS — S0990XA Unspecified injury of head, initial encounter: Secondary | ICD-10-CM | POA: Insufficient documentation

## 2011-08-29 DIAGNOSIS — Z9181 History of falling: Secondary | ICD-10-CM

## 2011-08-29 HISTORY — DX: Unspecified osteoarthritis, unspecified site: M19.90

## 2011-08-29 HISTORY — DX: Unspecified dementia, unspecified severity, without behavioral disturbance, psychotic disturbance, mood disturbance, and anxiety: F03.90

## 2011-08-29 HISTORY — DX: Essential (primary) hypertension: I10

## 2011-08-29 NOTE — ED Notes (Signed)
Called x ray to check on results.  Radiologist is behind with mult  pts  To evaluate.  Daughter notified.

## 2011-08-29 NOTE — ED Provider Notes (Signed)
History     CSN: [id number] Arrival date & time: 08/29/2011  3:04 AM  Chief Complaint  Patient presents with  . Fall   Patient is a 77 y.o. male presenting with fall. The history is provided by the patient and the EMS personnel [facility]).  Fall The accident occurred less than 1 hour ago. The fall occurred while standing. He fell from a height of 3 to 5 ft. Impact surface: NOT KNOWN. There was no blood loss. The point of impact was the head. Pain location: NONE. The pain is at a severity of 0/10. The patient is experiencing no pain. He was not ambulatory at the scene. Pertinent negatives include no abdominal pain, no vomiting, no headaches and no loss of consciousness. Treatment on scene includes a c-collar and a backboard.   FALL AT NH NO LOC HIT HEAD NO APPARENT INJURIES.   Past Medical History  Diagnosis Date  . Dementia   . Hypertension   . Arthritis   . Osteoporosis     History reviewed. No pertinent past surgical history.  History reviewed. No pertinent family history.  History  Substance Use Topics  . Smoking status: Unknown If Ever Smoked  . Smokeless tobacco: Not on file  . Alcohol Use: No      Review of Systems  Constitutional: Negative for diaphoresis.  HENT: Positive for neck pain.   Eyes: Negative for photophobia.  Respiratory: Negative for cough and shortness of breath.   Cardiovascular: Negative for chest pain and leg swelling.  Gastrointestinal: Negative for vomiting and abdominal pain.  Musculoskeletal: Negative for back pain.  Skin: Negative for rash.  Neurological: Negative for loss of consciousness, weakness and headaches.    Physical Exam  BP 137/76  Pulse 74  Temp(Src) 98.3 F (36.8 C) (Oral)  Resp 17  SpO2 100%  Physical Exam  Nursing note and vitals reviewed. Constitutional: He appears well-developed and well-nourished. No distress.  HENT:  Head: Normocephalic.  Mouth/Throat: Oropharynx is clear and moist.  Eyes: EOM are normal.  Pupils are equal, round, and reactive to light.  Neck: Neck supple.  Cardiovascular: Normal rate, regular rhythm and normal heart sounds.   Pulmonary/Chest: Effort normal and breath sounds normal. He exhibits no tenderness.  Abdominal: Soft. Bowel sounds are normal. There is no tenderness.  Musculoskeletal: Normal range of motion. He exhibits no edema and no tenderness.  Neurological: He is alert. No cranial nerve deficit. He exhibits normal muscle tone.  Skin: Skin is warm and dry.    ED Course  Procedures  Results for orders placed during the hospital encounter of 04/08/11  DIFFERENTIAL      Component Value Range   Neutrophils Relative 62  43 - 77 (%)   Neutro Abs 2.4  1.7 - 7.7 (K/uL)   Lymphocytes Relative 20  12 - 46 (%)   Lymphs Abs 0.8  0.7 - 4.0 (K/uL)   Monocytes Relative 14 (*) 3 - 12 (%)   Monocytes Absolute 0.5  0.1 - 1.0 (K/uL)   Eosinophils Relative 4  0 - 5 (%)   Eosinophils Absolute 0.2  0.0 - 0.7 (K/uL)   Basophils Relative 1  0 - 1 (%)   Basophils Absolute 0.0  0.0 - 0.1 (K/uL)  CBC      Component Value Range   WBC 3.8 (*) 4.0 - 10.5 (K/uL)   RBC 3.78 (*) 4.22 - 5.81 (MIL/uL)   Hemoglobin 11.6 (*) 13.0 - 17.0 (g/dL)   HCT 40.9 (*) 81.1 -  52.0 (%)   MCV 91.0  78.0 - 100.0 (fL)   MCH 30.7  26.0 - 34.0 (pg)   MCHC 33.7  30.0 - 36.0 (g/dL)   RDW 16.1  09.6 - 04.5 (%)   Platelets 204  150 - 400 (K/uL)  BASIC METABOLIC PANEL      Component Value Range   Sodium 135  135 - 145 (mEq/L)   Potassium 3.7  3.5 - 5.1 (mEq/L)   Chloride 105  96 - 112 (mEq/L)   CO2 23  19 - 32 (mEq/L)   Glucose, Bld 93  70 - 99 (mg/dL)   BUN 19  6 - 23 (mg/dL)   Creatinine, Ser 4.09  0.4 - 1.5 (mg/dL)   Calcium 9.5  8.4 - 81.1 (mg/dL)   GFR calc non Af Amer 55 (*) >60 (mL/min)   GFR calc Af Amer    >60 (mL/min)   Value: >60            The eGFR has been calculated     using the MDRD equation.     This calculation has not been     validated in all clinical     situations.      eGFR's persistently     <60 mL/min signify     possible Chronic Kidney Disease.  HEPATIC FUNCTION PANEL      Component Value Range   Total Protein 6.6  6.0 - 8.3 (g/dL)   Albumin 2.9 (*) 3.5 - 5.2 (g/dL)   AST 20  0 - 37 (U/L)   ALT 18  0 - 53 (U/L)   Alkaline Phosphatase 80  39 - 117 (U/L)   Total Bilirubin 0.6  0.3 - 1.2 (mg/dL)   Bilirubin, Direct 0.1  0.0 - 0.3 (mg/dL)   Indirect Bilirubin 0.5  0.3 - 0.9 (mg/dL)  URINALYSIS, ROUTINE W REFLEX MICROSCOPIC      Component Value Range   Color, Urine YELLOW  YELLOW    Appearance CLEAR  CLEAR    Specific Gravity, Urine >1.030 (*) 1.005 - 1.030    pH 5.5  5.0 - 8.0    Glucose, UA NEGATIVE  NEGATIVE (mg/dL)   Hgb urine dipstick NEGATIVE  NEGATIVE    Bilirubin Urine NEGATIVE  NEGATIVE    Ketones, ur NEGATIVE  NEGATIVE (mg/dL)   Protein, ur NEGATIVE  NEGATIVE (mg/dL)   Urobilinogen, UA 0.2  0.0 - 1.0 (mg/dL)   Nitrite NEGATIVE  NEGATIVE    Leukocytes, UA    NEGATIVE    Value: NEGATIVE MICROSCOPIC NOT DONE ON URINES WITH NEGATIVE PROTEIN, BLOOD, LEUKOCYTES, NITRITE, OR GLUCOSE <1000 mg/dL.  INFLUENZA PANEL BY PCR      Component Value Range   Influenza A By PCR   (*) NEGATIVE    Value: POSITIVE RESULT CALLED TO, READ BACK BY AND VERIFIED WITH: Woodroe Chen AT 2150 BY HUFFINES,S ON 04/08/11   Influenza B By PCR NEGATIVE  NEGATIVE    H1N1 flu by pcr    NOT DETECTED    Value: NOT DETECTED            The Xpert Flu assay (FDA approved for     nasal aspirates or washes and     nasopharyngeal swab specimens), is     intended as an aid in the diagnosis of     influenza and should not be used as     a sole basis for treatment.    Results for orders placed during the hospital encounter  of 04/08/11  DIFFERENTIAL      Component Value Range   Neutrophils Relative 62  43 - 77 (%)   Neutro Abs 2.4  1.7 - 7.7 (K/uL)   Lymphocytes Relative 20  12 - 46 (%)   Lymphs Abs 0.8  0.7 - 4.0 (K/uL)   Monocytes Relative 14 (*) 3 - 12 (%)   Monocytes  Absolute 0.5  0.1 - 1.0 (K/uL)   Eosinophils Relative 4  0 - 5 (%)   Eosinophils Absolute 0.2  0.0 - 0.7 (K/uL)   Basophils Relative 1  0 - 1 (%)   Basophils Absolute 0.0  0.0 - 0.1 (K/uL)  CBC      Component Value Range   WBC 3.8 (*) 4.0 - 10.5 (K/uL)   RBC 3.78 (*) 4.22 - 5.81 (MIL/uL)   Hemoglobin 11.6 (*) 13.0 - 17.0 (g/dL)   HCT 16.1 (*) 09.6 - 52.0 (%)   MCV 91.0  78.0 - 100.0 (fL)   MCH 30.7  26.0 - 34.0 (pg)   MCHC 33.7  30.0 - 36.0 (g/dL)   RDW 04.5  40.9 - 81.1 (%)   Platelets 204  150 - 400 (K/uL)  BASIC METABOLIC PANEL      Component Value Range   Sodium 135  135 - 145 (mEq/L)   Potassium 3.7  3.5 - 5.1 (mEq/L)   Chloride 105  96 - 112 (mEq/L)   CO2 23  19 - 32 (mEq/L)   Glucose, Bld 93  70 - 99 (mg/dL)   BUN 19  6 - 23 (mg/dL)   Creatinine, Ser 9.14  0.4 - 1.5 (mg/dL)   Calcium 9.5  8.4 - 78.2 (mg/dL)   GFR calc non Af Amer 55 (*) >60 (mL/min)   GFR calc Af Amer    >60 (mL/min)   Value: >60            The eGFR has been calculated     using the MDRD equation.     This calculation has not been     validated in all clinical     situations.     eGFR's persistently     <60 mL/min signify     possible Chronic Kidney Disease.  HEPATIC FUNCTION PANEL      Component Value Range   Total Protein 6.6  6.0 - 8.3 (g/dL)   Albumin 2.9 (*) 3.5 - 5.2 (g/dL)   AST 20  0 - 37 (U/L)   ALT 18  0 - 53 (U/L)   Alkaline Phosphatase 80  39 - 117 (U/L)   Total Bilirubin 0.6  0.3 - 1.2 (mg/dL)   Bilirubin, Direct 0.1  0.0 - 0.3 (mg/dL)   Indirect Bilirubin 0.5  0.3 - 0.9 (mg/dL)  URINALYSIS, ROUTINE W REFLEX MICROSCOPIC      Component Value Range   Color, Urine YELLOW  YELLOW    Appearance CLEAR  CLEAR    Specific Gravity, Urine >1.030 (*) 1.005 - 1.030    pH 5.5  5.0 - 8.0    Glucose, UA NEGATIVE  NEGATIVE (mg/dL)   Hgb urine dipstick NEGATIVE  NEGATIVE    Bilirubin Urine NEGATIVE  NEGATIVE    Ketones, ur NEGATIVE  NEGATIVE (mg/dL)   Protein, ur NEGATIVE  NEGATIVE (mg/dL)     Urobilinogen, UA 0.2  0.0 - 1.0 (mg/dL)   Nitrite NEGATIVE  NEGATIVE    Leukocytes, UA    NEGATIVE    Value: NEGATIVE MICROSCOPIC NOT DONE ON URINES WITH  NEGATIVE PROTEIN, BLOOD, LEUKOCYTES, NITRITE, OR GLUCOSE <1000 mg/dL.  INFLUENZA PANEL BY PCR      Component Value Range   Influenza A By PCR   (*) NEGATIVE    Value: POSITIVE RESULT CALLED TO, READ BACK BY AND VERIFIED WITH: Woodroe Chen AT 2150 BY HUFFINES,S ON 04/08/11   Influenza B By PCR NEGATIVE  NEGATIVE    H1N1 flu by pcr    NOT DETECTED    Value: NOT DETECTED            The Xpert Flu assay (FDA approved for     nasal aspirates or washes and     nasopharyngeal swab specimens), is     intended as an aid in the diagnosis of     influenza and should not be used as     a sole basis for treatment.   Ct Head Wo Contrast  08/29/2011  *RADIOLOGY REPORT*  Clinical Data:  Fall.  CT HEAD WITHOUT CONTRAST CT CERVICAL SPINE WITHOUT CONTRAST  Technique:  Multidetector CT imaging of the head and cervical spine was performed following the standard protocol without intravenous contrast.  Multiplanar CT image reconstructions of the cervical spine were also generated.  Comparison:  CT head 02/02/2011  CT HEAD  Findings: Moderate diffuse cerebral and cerebellar atrophy. Low attenuation changes in the deep white matter consistent with small vessel ischemic changes. The ventricles and sulci are otherwise symmetrical without significant effacement, displacement, or dilatation. No mass effect or midline shift. No abnormal extra- axial fluid collections. The grey-white matter junction is distinct. Basal cisterns are not effaced. No acute intracranial hemorrhage. No depressed skull fractures.  Mild mucosal membrane thickening in the maxillary antra.  No acute air-fluid levels demonstrated in the visualized paranasal sinuses. Mastoid air cells are not opacified.  No significant change since previous study.  IMPRESSION: Chronic atrophic and small vessel ischemic  changes.  No acute intracranial abnormalities demonstrated.  CT CERVICAL SPINE  Findings: Diffuse bone demineralization.  Diffuse degenerative changes throughout the cervical spine with narrowed cervical interspaces and small endplate osteophytes present.  There is mild anterior subluxation of C7 on T1. Mild anterior compression deformity of T1.  This is of indeterminate age.  Acute fracture here is not excluded.  There is compression of C6 with mild ballooning of interspaces at T2-3 and T1-2.  These changes may represent changes of osteoporosis but there are age indeterminate of acute changes not excluded.  There is no prevertebral soft tissue swelling.  Posterior elements appear well-aligned.  Lateral masses of C1 are symmetrical.  The odontoid process appears intact. Degenerative changes throughout facet joints.  No significant infiltration into the paraspinal soft tissues.  Cervical lymph nodes are not pathologically enlarged.  IMPRESSION: Diffuse demineralization and degenerative changes.  Slight anterior subluxation of C7-T1 with anterior wedging of T1 is of indeterminate age.  Acute fracture is not excluded.  Consider MRI for further evaluation to exclude ligamentous injury.  Additional compression of C3, C6, and ballooning of interspaces at T2-3 and T1- 2 are probably due to osteoporosis but acute changes not excluded.  Original Report Authenticated By: Marlon Pel, M.D.   Ct Cervical Spine Wo Contrast  08/29/2011  *RADIOLOGY REPORT*  Clinical Data:  Fall.  CT HEAD WITHOUT CONTRAST CT CERVICAL SPINE WITHOUT CONTRAST  Technique:  Multidetector CT imaging of the head and cervical spine was performed following the standard protocol without intravenous contrast.  Multiplanar CT image reconstructions of the cervical spine were also generated.  Comparison:  CT head 02/02/2011  CT HEAD  Findings: Moderate diffuse cerebral and cerebellar atrophy. Low attenuation changes in the deep white matter consistent  with small vessel ischemic changes. The ventricles and sulci are otherwise symmetrical without significant effacement, displacement, or dilatation. No mass effect or midline shift. No abnormal extra- axial fluid collections. The grey-white matter junction is distinct. Basal cisterns are not effaced. No acute intracranial hemorrhage. No depressed skull fractures.  Mild mucosal membrane thickening in the maxillary antra.  No acute air-fluid levels demonstrated in the visualized paranasal sinuses. Mastoid air cells are not opacified.  No significant change since previous study.  IMPRESSION: Chronic atrophic and small vessel ischemic changes.  No acute intracranial abnormalities demonstrated.  CT CERVICAL SPINE  Findings: Diffuse bone demineralization.  Diffuse degenerative changes throughout the cervical spine with narrowed cervical interspaces and small endplate osteophytes present.  There is mild anterior subluxation of C7 on T1. Mild anterior compression deformity of T1.  This is of indeterminate age.  Acute fracture here is not excluded.  There is compression of C6 with mild ballooning of interspaces at T2-3 and T1-2.  These changes may represent changes of osteoporosis but there are age indeterminate of acute changes not excluded.  There is no prevertebral soft tissue swelling.  Posterior elements appear well-aligned.  Lateral masses of C1 are symmetrical.  The odontoid process appears intact. Degenerative changes throughout facet joints.  No significant infiltration into the paraspinal soft tissues.  Cervical lymph nodes are not pathologically enlarged.  IMPRESSION: Diffuse demineralization and degenerative changes.  Slight anterior subluxation of C7-T1 with anterior wedging of T1 is of indeterminate age.  Acute fracture is not excluded.  Consider MRI for further evaluation to exclude ligamentous injury.  Additional compression of C3, C6, and ballooning of interspaces at T2-3 and T1- 2 are probably due to  osteoporosis but acute changes not excluded.  Original Report Authenticated By: Marlon Pel, M.D.     MDM FALL AT NH IN NAD ALERT ALL EXTREMITIES WITH GOOD ROM, NO LOC NO SYNCOPE. HEAD AND NECK CT PENDING. RESULTS NEGATIVE GOOD ROM OF NECK. WILL DISCHARGE BACK TO NH.       Shelda Jakes, MD 08/29/11 [phone]

## 2011-08-29 NOTE — ED Notes (Signed)
On  Way to [place] and fell,  LSB  And c collar.  Injury to back of head

## 2011-08-29 NOTE — ED Notes (Signed)
Report called to [facility]. 

## 2011-08-29 NOTE — ED Notes (Signed)
From [facility], fell this am on way to [facility].  Alert,talking, confused, with hx of dementia.

## 2011-08-29 NOTE — ED Notes (Signed)
Dr Glenis Smoker met pt on arrival.  LSB removed, c-collar remains.  Daughter in to see pt.

## 2011-09-12 LAB — BASIC METABOLIC PANEL
BUN: 21
CO2: 25
Chloride: 104
Chloride: 105
Creatinine, Ser: 0.98
GFR calc non Af Amer: >60
Glucose, Bld: 123 — ABNORMAL HIGH
Potassium: 3.4 — ABNORMAL LOW
Sodium: 135

## 2011-09-12 LAB — CBC
HCT: 34.2 — ABNORMAL LOW
Hemoglobin: 11.3 — ABNORMAL LOW
RDW: 18.6 — ABNORMAL HIGH

## 2011-09-12 LAB — DIFFERENTIAL
Eosinophils Relative: 1
Lymphocytes Relative: 6 — ABNORMAL LOW
Lymphs Abs: 0.5 — ABNORMAL LOW
Monocytes Absolute: 0.5

## 2011-09-16 LAB — HEMOGLOBIN AND HEMATOCRIT, BLOOD
HCT: 32.2 — ABNORMAL LOW
Hemoglobin: 9.3 — ABNORMAL LOW

## 2011-09-16 LAB — CROSSMATCH
ABO/RH(D): O POS
Antibody Screen: NEGATIVE

## 2011-09-16 LAB — DIFFERENTIAL
Basophils Absolute: 0
Basophils Absolute: 0
Basophils Absolute: 0
Basophils Relative: 0
Basophils Relative: 0
Basophils Relative: 0
Eosinophils Absolute: 0.1
Eosinophils Absolute: 0.1
Eosinophils Absolute: 0.2
Eosinophils Relative: 1
Eosinophils Relative: 1
Eosinophils Relative: 2
Eosinophils Relative: 4
Eosinophils Relative: 4
Lymphocytes Relative: 10 — ABNORMAL LOW
Lymphocytes Relative: 11 — ABNORMAL LOW
Lymphocytes Relative: 11 — ABNORMAL LOW
Lymphocytes Relative: 6 — ABNORMAL LOW
Lymphocytes Relative: 8 — ABNORMAL LOW
Lymphs Abs: 0.6 — ABNORMAL LOW
Lymphs Abs: 0.8
Lymphs Abs: 0.9
Monocytes Absolute: 0.7
Monocytes Relative: 8
Monocytes Relative: 9
Neutro Abs: 6.3
Neutro Abs: 6.5
Neutrophils Relative %: 83 — ABNORMAL HIGH

## 2011-09-16 LAB — BASIC METABOLIC PANEL
BUN: 14
BUN: 18
BUN: 18
BUN: 22
CO2: 26
CO2: 27
CO2: 27
CO2: 27
Calcium: 8.8
Calcium: 8.9
Calcium: 9
Chloride: 102
Chloride: 103
Chloride: 103
Chloride: 106
Creatinine, Ser: 0.86
Creatinine, Ser: 0.89
Creatinine, Ser: 0.96
Creatinine, Ser: 1.08
GFR calc Af Amer: >60
GFR calc Af Amer: >60
GFR calc Af Amer: >60
GFR calc non Af Amer: >60
GFR calc non Af Amer: >60
GFR calc non Af Amer: >60
Glucose, Bld: 136 — ABNORMAL HIGH
Glucose, Bld: 140 — ABNORMAL HIGH
Glucose, Bld: 96
Potassium: 3.7
Potassium: 3.7
Potassium: 3.8
Potassium: 4.2
Potassium: 4.4
Sodium: 134 — ABNORMAL LOW
Sodium: 136
Sodium: 137
Sodium: 137

## 2011-09-16 LAB — CARDIAC PANEL(CRET KIN+CKTOT+MB+TROPI)
CK, MB: 2.3
Relative Index: 2.2
Relative Index: INVALID
Total CK: 162
Total CK: 210
Troponin I: 0.01
Troponin I: 0.02

## 2011-09-16 LAB — CBC
HCT: 27.8 — ABNORMAL LOW
HCT: 29.1 — ABNORMAL LOW
HCT: 30 — ABNORMAL LOW
Hemoglobin: 10.3 — ABNORMAL LOW
MCHC: 33.9
MCHC: 34.1
MCV: 82.2
MCV: 83.1
MCV: 83.3
MCV: 84.2
Platelets: 381
Platelets: 438 — ABNORMAL HIGH
Platelets: 445 — ABNORMAL HIGH
Platelets: 487 — ABNORMAL HIGH
RBC: 3.23 — ABNORMAL LOW
RBC: 3.39 — ABNORMAL LOW
RDW: 18.5 — ABNORMAL HIGH
RDW: 18.5 — ABNORMAL HIGH
RDW: 18.5 — ABNORMAL HIGH
WBC: 10
WBC: 7.3
WBC: 8.2
WBC: 9.6

## 2011-09-16 LAB — URINALYSIS, ROUTINE W REFLEX MICROSCOPIC
Glucose, UA: NEGATIVE
Nitrite: NEGATIVE
Specific Gravity, Urine: 1.025
pH: 6

## 2011-09-16 LAB — COMPREHENSIVE METABOLIC PANEL
ALT: 21
AST: 29
Alkaline Phosphatase: 100
CO2: 28
Chloride: 101
Creatinine, Ser: 0.87
GFR calc Af Amer: >60
GFR calc non Af Amer: >60
Total Bilirubin: 0.4

## 2011-09-16 LAB — PROTIME-INR
INR: 1.2
Prothrombin Time: 15.9 — ABNORMAL HIGH

## 2011-09-16 LAB — PREALBUMIN: Prealbumin: 6.5 — ABNORMAL LOW

## 2011-09-16 LAB — HEMOGLOBIN: Hemoglobin: 10.7 — ABNORMAL LOW

## 2011-09-16 LAB — MAGNESIUM: Magnesium: 2.2

## 2011-10-06 LAB — COMPREHENSIVE METABOLIC PANEL
ALT: 12
Albumin: 3.1 — ABNORMAL LOW
Alkaline Phosphatase: 103
Calcium: 9.1
GFR calc Af Amer: >60
Glucose, Bld: 130 — ABNORMAL HIGH
Potassium: 3.3 — ABNORMAL LOW
Sodium: 137
Total Protein: 7.5

## 2011-10-06 LAB — COMPREHENSIVE METABOLIC PANEL WITH GFR
AST: 15
BUN: 28 — ABNORMAL HIGH
CO2: 22
Chloride: 109
Creatinine, Ser: 1.04
GFR calc non Af Amer: >60
Total Bilirubin: 1.2

## 2011-10-06 LAB — URINALYSIS, ROUTINE W REFLEX MICROSCOPIC
Bilirubin Urine: NEGATIVE
Glucose, UA: NEGATIVE
Hgb urine dipstick: NEGATIVE
Ketones, ur: NEGATIVE
Leukocytes, UA: NEGATIVE
Nitrite: NEGATIVE
Specific Gravity, Urine: >1.03 — ABNORMAL HIGH
Urobilinogen, UA: 0.2
pH: 5.5

## 2011-10-06 LAB — URINE MICROSCOPIC-ADD ON

## 2011-10-06 LAB — CBC
HCT: 34.2 — ABNORMAL LOW
Hemoglobin: 11.2 — ABNORMAL LOW
MCHC: 32.7
MCV: 78.7
Platelets: 374
RBC: 4.34
RDW: 15.6 — ABNORMAL HIGH
WBC: 9.2

## 2011-10-06 LAB — DIFFERENTIAL
Basophils Absolute: 0
Basophils Relative: 0
Eosinophils Absolute: 0
Eosinophils Relative: 0
Lymphocytes Relative: 10 — ABNORMAL LOW
Lymphs Abs: 0.9
Monocytes Absolute: 0.9 — ABNORMAL HIGH
Monocytes Relative: 10
Neutro Abs: 7.3
Neutrophils Relative %: 79 — ABNORMAL HIGH

## 2011-10-12 ENCOUNTER — Emergency Department (HOSPITAL_COMMUNITY): Payer: Medicare Other

## 2011-10-12 ENCOUNTER — Encounter (HOSPITAL_COMMUNITY): Payer: Self-pay

## 2011-10-12 ENCOUNTER — Emergency Department (HOSPITAL_COMMUNITY)
Admission: EM | Admit: 2011-10-12 | Discharge: 2011-10-12 | Disposition: A | Discharge status: Nursing Home (Includes ICF) | Payer: Medicare Other | Attending: Emergency Medicine | Admitting: Emergency Medicine

## 2011-10-12 DIAGNOSIS — M25559 Pain in unspecified hip: Secondary | ICD-10-CM | POA: Insufficient documentation

## 2011-10-12 DIAGNOSIS — M810 Age-related osteoporosis without current pathological fracture: Secondary | ICD-10-CM | POA: Insufficient documentation

## 2011-10-12 DIAGNOSIS — W19XXXA Unspecified fall, initial encounter: Secondary | ICD-10-CM

## 2011-10-12 DIAGNOSIS — G319 Degenerative disease of nervous system, unspecified: Secondary | ICD-10-CM | POA: Insufficient documentation

## 2011-10-12 DIAGNOSIS — Y9210 Unspecified residential institution as the place of occurrence of the external cause: Secondary | ICD-10-CM | POA: Insufficient documentation

## 2011-10-12 DIAGNOSIS — I10 Essential (primary) hypertension: Secondary | ICD-10-CM | POA: Insufficient documentation

## 2011-10-12 DIAGNOSIS — F0390 Unspecified dementia without behavioral disturbance: Secondary | ICD-10-CM | POA: Insufficient documentation

## 2011-10-12 DIAGNOSIS — W050XXA Fall from non-moving wheelchair, initial encounter: Secondary | ICD-10-CM | POA: Insufficient documentation

## 2011-10-12 DIAGNOSIS — M129 Arthropathy, unspecified: Secondary | ICD-10-CM | POA: Insufficient documentation

## 2011-10-12 NOTE — ED Notes (Addendum)
Pt alert and at baseline, Resp even and unlabored. NAD at this time. D/C instructions reviewed with family. Pt to POV via w/c. Family to transport to [facility].

## 2011-10-12 NOTE — ED Notes (Signed)
Pt's daughter reports pt has had recent UTI and had been on antibiotics.

## 2011-10-12 NOTE — ED Provider Notes (Addendum)
Scribed for Donnetta Hutching, MD, the patient was seen in room APA01/APA01 . This chart was scribed by Ellie Lunch.   CSN: [id number] Arrival date & time: 10/12/2011  4:45 PM   First MD Initiated Contact with Patient 10/12/11 1649      Chief Complaint  Patient presents with  . Hip Pain  . Fall    (Consider location/radiation/quality/duration/timing/severity/associated sxs/prior treatment) HPI Level 5 caveat for dementia. [name] is a 77 y.o. male  brought in by ambulance, who presents to the Emergency Department complaining of fall. Pt is resident of [facility] and has a history of dementia. Pt denies any current pain. Per daughter, [facility] staff reports PT fell and hit head and shoulder. Also said Pt c/o left hip pain after fall.  Brought in on back board and c collar with head immobilization.  Accompanied to ED by daughter.    Past Medical History  Diagnosis Date  . Dementia   . Hypertension   . Arthritis   . Osteoporosis     History reviewed. No pertinent past surgical history.  No family history on file.  History  Substance Use Topics  . Smoking status: Unknown If Ever Smoked  . Smokeless tobacco: Not on file  . Alcohol Use: No     Review of Systems  Unable to perform ROS: Dementia    Allergies  Review of patient's allergies indicates no known allergies.  Home Medications   Current Outpatient Rx  Name Route Sig Dispense Refill  . ACETAMINOPHEN 500 MG PO TABS Oral Take 500 mg by mouth every 6 (six) hours as needed.      Marland Kitchen CALCIUM CITRATE 950 MG PO TABS Oral Take 1 tablet by mouth daily.      Marland Kitchen CITALOPRAM HYDROBROMIDE 10 MG PO TABS Oral Take 10 mg by mouth daily.      Marland Kitchen DOCUSATE SODIUM 100 MG PO CAPS Oral Take 100 mg by mouth 2 (two) times daily.      . MEGESTROL ACETATE 40 MG/ML PO SUSP Oral Take 200 mg by mouth daily.      Marland Kitchen METOPROLOL SUCCINATE 25 MG PO TB24 Oral Take 12.5 mg by mouth 2 (two) times daily.      Marland Kitchen ONE-DAILY MULTI VITAMINS PO TABS  Oral Take 1 tablet by mouth daily.      Marland Kitchen PANTOPRAZOLE SODIUM 40 MG PO TBEC Oral Take 40 mg by mouth daily.      Marland Kitchen POTASSIUM CHLORIDE CRYS CR 20 MEQ PO TBCR Oral Take 20 mEq by mouth 1 day or 1 dose.      Marland Kitchen PROMETHAZINE HCL 25 MG PO TABS Oral Take 25 mg by mouth every 6 (six) hours as needed.      Marland Kitchen TAMSULOSIN HCL 0.4 MG PO CAPS Oral Take 0.4 mg by mouth 1 day or 1 dose.        BP 144/78  Pulse 79  Temp(Src) 97.7 F (36.5 C) (Oral)  Resp 21  SpO2 97%  Physical Exam  Nursing note and vitals reviewed. Constitutional: He is oriented to person, place, and time. He appears well-developed and well-nourished.  HENT:  Head: Normocephalic and atraumatic. Head is without abrasion, without contusion and without laceration.  Eyes: Conjunctivae and EOM are normal.  Neck: Normal range of motion. Neck supple.  Cardiovascular: Normal rate, regular rhythm and normal heart sounds.   Pulmonary/Chest: Effort normal and breath sounds normal.  Abdominal: Soft. There is no tenderness.  Musculoskeletal: Normal range of motion.  Cervical back: He exhibits no tenderness.       Thoracic back: He exhibits no tenderness.       Lumbar back: He exhibits no tenderness.       Pelvis not TTP. No pain with straight leg raises.  Moves all extremities freely.   Neurological: He is alert and oriented to person, place, and time. Coordination normal.  Skin: Skin is warm and dry.  Psychiatric:       Mental status impaired due to dementia.    ED Course  Procedures (including critical care time)  OTHER DATA REVIEWED: Nursing notes, vital signs, and past medical records reviewed.   DIAGNOSTIC STUDIES: Oxygen Saturation is 97% on room air, normal by my interpretation.    Ct Head Wo Contrast  10/12/2011  *RADIOLOGY REPORT*  Clinical Data: . Fall with a blow to the head.  CT HEAD WITHOUT CONTRAST  Technique:  Contiguous axial images were obtained from the base of the skull through the vertex without contrast.   Comparison: Head CT scan 08/29/2011.  Findings: The brain is atrophic with extensive chronic microvascular ischemic change.  Small contusion over the right aspect of the frontal bone is noted.  No underlying fracture. Calvarium is intact.  No pneumocephalus or hydrocephalus.  No evidence of acute intracranial abnormality including acute infarction, hemorrhage, mass lesion, mass effect, midline shift or abnormal extra-axial fluid collection. Walls of the maxillary sinuses appear thickened with some mild mucosal prominence identified.  IMPRESSION:  1.  Soft tissue contusion over the right frontal bone without underlying fracture. 2.  Atrophy and chronic microvascular ischemic change. 3.  Chronic, mild appearing bilateral maxillary sinus disease.  Original Report Authenticated By: Bernadene Bell. Maricela Curet, M.D.    No diagnosis found.  17:00 EDP at PT bedside. Discussed plan to CT head to rule out acute trauma. Plan to discharge.   MDM  Daughter reports normal behavior. Patient allegedly struck his head but I see no evidence of same. CT head negative. Patient is at baseline    I personally performed the services described in this documentation, which was scribed in my presence. The recorded information has been reviewed and considered.     Donnetta Hutching, MD 10/12/11 1858  Donnetta Hutching, MD 10/12/11 [phone]

## 2011-10-12 NOTE — ED Notes (Signed)
G[place] Dishman CNA at [facility] and was told pt stood up beside bed and foot got caught on wheelchair and pt fell backwards.  C/O pain to left hip.  Staff says pt did hit head.

## 2011-10-12 NOTE — ED Notes (Signed)
EMS reports pt fell while getting out of wheelchair.  Pt c/o left hip pain.  Pt alert but confused, will call facility to see if this is pt's baseline.

## 2011-11-03 ENCOUNTER — Emergency Department (HOSPITAL_COMMUNITY)
Admission: EM | Admit: 2011-11-03 | Discharge: 2011-11-03 | Disposition: A | Discharge status: Nursing Home (Includes ICF) | Payer: Medicare Other | Attending: Emergency Medicine | Admitting: Emergency Medicine

## 2011-11-03 ENCOUNTER — Emergency Department (HOSPITAL_COMMUNITY): Payer: Medicare Other

## 2011-11-03 ENCOUNTER — Encounter (HOSPITAL_COMMUNITY): Payer: Self-pay

## 2011-11-03 DIAGNOSIS — M545 Low back pain, unspecified: Secondary | ICD-10-CM | POA: Insufficient documentation

## 2011-11-03 DIAGNOSIS — W19XXXA Unspecified fall, initial encounter: Secondary | ICD-10-CM

## 2011-11-03 DIAGNOSIS — Y9210 Unspecified residential institution as the place of occurrence of the external cause: Secondary | ICD-10-CM | POA: Insufficient documentation

## 2011-11-03 DIAGNOSIS — M899 Disorder of bone, unspecified: Secondary | ICD-10-CM | POA: Insufficient documentation

## 2011-11-03 DIAGNOSIS — M129 Arthropathy, unspecified: Secondary | ICD-10-CM | POA: Insufficient documentation

## 2011-11-03 DIAGNOSIS — I10 Essential (primary) hypertension: Secondary | ICD-10-CM | POA: Insufficient documentation

## 2011-11-03 DIAGNOSIS — W06XXXA Fall from bed, initial encounter: Secondary | ICD-10-CM | POA: Insufficient documentation

## 2011-11-03 DIAGNOSIS — G8929 Other chronic pain: Secondary | ICD-10-CM | POA: Insufficient documentation

## 2011-11-03 DIAGNOSIS — F0390 Unspecified dementia without behavioral disturbance: Secondary | ICD-10-CM | POA: Insufficient documentation

## 2011-11-03 NOTE — ED Notes (Signed)
Pt found lying in floor beside bed at [facility]. Pt has dementia and does not know what happened

## 2011-11-03 NOTE — ED Provider Notes (Signed)
Scribed for [employer]. Colon Branch, MD, the patient was seen in room APA01/APA01 . This chart was scribed by Ellie Lunch.   CSN: [id number] Arrival date & time: 11/03/2011 10:46 AM   First MD Initiated Contact with Patient 11/03/11 1108      No chief complaint on file.   (Consider location/radiation/quality/duration/timing/severity/associated sxs/prior treatment) The history is provided by the patient. No language interpreter was used.   Pt seen at 11:13 AM Level 5 caveat for dementia [name] is a 77 y.o. male brought in by ambulance, who presents to the Emergency Department complaining of fall. Pt, a resident of [facility], rolled out of bed this morning to the left.  Per daughter, staff said Pt hit his head in fall. Pt reports no injury or pain. Says he feels fine.  Resident of [facility] for 4 years.  Past Medical History  Diagnosis Date  . Dementia   . Hypertension   . Arthritis   . Osteoporosis     History reviewed. No pertinent past surgical history. Partial hip replacement   No family history on file.  History  Substance Use Topics  . Smoking status: Unknown If Ever Smoked  . Smokeless tobacco: Not on file  . Alcohol Use: No     Review of Systems  Unable to perform ROS: Dementia    Allergies  Review of patient's allergies indicates no known allergies.  Home Medications   Current Outpatient Rx  Name Route Sig Dispense Refill  . ACETAMINOPHEN 500 MG PO TABS Oral Take 500 mg by mouth every 6 (six) hours as needed.      Marland Kitchen CALCIUM CITRATE 950 MG PO TABS Oral Take 1 tablet by mouth daily.      Marland Kitchen CITALOPRAM HYDROBROMIDE 10 MG PO TABS Oral Take 10 mg by mouth daily.      Marland Kitchen DOCUSATE SODIUM 100 MG PO CAPS Oral Take 100 mg by mouth 2 (two) times daily.      . MEGESTROL ACETATE 40 MG/ML PO SUSP Oral Take 200 mg by mouth daily.      Marland Kitchen METOPROLOL SUCCINATE 25 MG PO TB24 Oral Take 12.5 mg by mouth 2 (two) times daily.      Marland Kitchen ONE-DAILY MULTI VITAMINS PO TABS  Oral Take 1 tablet by mouth daily.      Marland Kitchen PANTOPRAZOLE SODIUM 40 MG PO TBEC Oral Take 40 mg by mouth daily.      Marland Kitchen POTASSIUM CHLORIDE CRYS CR 20 MEQ PO TBCR Oral Take 20 mEq by mouth 1 day or 1 dose.      Marland Kitchen PROMETHAZINE HCL 25 MG PO TABS Oral Take 25 mg by mouth every 6 (six) hours as needed.      Marland Kitchen TAMSULOSIN HCL 0.4 MG PO CAPS Oral Take 0.4 mg by mouth 1 day or 1 dose.        BP 132/81  Pulse 66  Temp(Src) 97.8 F (36.6 C) (Oral)  Resp 20  SpO2 98%  Physical Exam  Nursing note and vitals reviewed. Constitutional: He appears well-developed and well-nourished.  HENT:  Head: Normocephalic and atraumatic.  Eyes: EOM are normal. Pupils are equal, round, and reactive to light.  Neck: Normal range of motion. Neck supple.  Cardiovascular: Normal rate, regular rhythm and normal heart sounds.   Pulmonary/Chest: Effort normal and breath sounds normal. No respiratory distress.  Abdominal: Soft. Bowel sounds are normal. There is no tenderness.  Musculoskeletal: He exhibits no tenderness.  Neurological: He is alert.  Skin:  Skin is warm and dry.    ED Course  Procedures (including critical care time) OTHER DATA REVIEWED: Nursing notes, vital signs, and past medical records reviewed.   DIAGNOSTIC STUDIES: Oxygen Saturation is 98% on room air, normal by my interpretation.     Labs Reviewed - No data to display Dg Lumbar Spine Complete  11/03/2011  *RADIOLOGY REPORT*  Clinical Data: Status post fall.  Pain  LUMBAR SPINE - COMPLETE 4+ VIEW  Comparison: 11/27/2006  Findings: There is a first-degree anterolisthesis of L5 on S1.  A compression fracture is noted involving the L1 vertebra.  Loss of approximately 40% of the vertebral body height noted.  Bones appeared diffusely osteopenic.  Mild disc space narrowing and ventral endplate spurring is noted at T12-L1, L1-2 and L2-3.  IMPRESSION:  1.  L1 compression fracture.  Age indeterminate. Per CMS PQRS reporting requirements (PQRS Measure 24):  Given the patient's age of greater than 50 and the fracture site (hip, distal radius, or spine), the patient should be tested for osteoporosis using DXA, and the appropriate treatment considered based on the DXA results. 2.  First-degree anterolisthesis of L5 on S1.  Original Report Authenticated By: Rosealee Albee, M.D.   Ct Head Wo Contrast  11/03/2011  *RADIOLOGY REPORT*  Clinical Data:  Rolled out of bed.  Hit head.  History of dementia.  CT HEAD WITHOUT CONTRAST CT CERVICAL SPINE WITHOUT CONTRAST  Technique:  Multidetector CT imaging of the head and cervical spine was performed following the standard protocol without intravenous contrast.  Multiplanar CT image reconstructions of the cervical spine were also generated.  Comparison:  10/12/2011  CT HEAD  Findings: There is diffuse patchy low density throughout the subcortical and periventricular white matter consistent with chronic small vessel ischemic change.  There is prominence of the sulci and ventricles consistent with brain atrophy.  There is no evidence for acute brain infarct, hemorrhage or mass.  Similar appearance of partial opacification of the maxillary sinuses the skull appears intact.  IMPRESSION:  1.  Small vessel ischemic disease and brain atrophy. 2.  No acute intracranial abnormalities.  CT CERVICAL SPINE  Findings: Normal alignment of the cervical spine.  The prevertebral soft tissue space appears within normal limits.  The bones are diffusely osteopenic and there is multilevel disc space narrowing and ventral spurring.  No fractures or subluxations identified.  Lung apices are clear.  IMPRESSION:  1.  No acute findings identified.  Original Report Authenticated By: Rosealee Albee, M.D.   Ct Cervical Spine Wo Contrast  11/03/2011  *RADIOLOGY REPORT*  Clinical Data:  Rolled out of bed.  Hit head.  History of dementia.  CT HEAD WITHOUT CONTRAST CT CERVICAL SPINE WITHOUT CONTRAST  Technique:  Multidetector CT imaging of the head and  cervical spine was performed following the standard protocol without intravenous contrast.  Multiplanar CT image reconstructions of the cervical spine were also generated.  Comparison:  10/12/2011  CT HEAD  Findings: There is diffuse patchy low density throughout the subcortical and periventricular white matter consistent with chronic small vessel ischemic change.  There is prominence of the sulci and ventricles consistent with brain atrophy.  There is no evidence for acute brain infarct, hemorrhage or mass.  Similar appearance of partial opacification of the maxillary sinuses the skull appears intact.  IMPRESSION:  1.  Small vessel ischemic disease and brain atrophy. 2.  No acute intracranial abnormalities.  CT CERVICAL SPINE  Findings: Normal alignment of the cervical spine.  The  prevertebral soft tissue space appears within normal limits.  The bones are diffusely osteopenic and there is multilevel disc space narrowing and ventral spurring.  No fractures or subluxations identified.  Lung apices are clear.  IMPRESSION:  1.  No acute findings identified.  Original Report Authenticated By: Rosealee Albee, M.D.     No diagnosis found.  11:14 AM EDP at PT bedside to remove c-collar and back board.  2:29 PM PT recheck. EDP discussed xrays show no acute findings. Plan to discharge.   MDM  Patiient is a resident at a nursing home and fell out of bed. There was no LOC. He has chronic low back pain and continues to have back pain. CT scans of head and neck are negative, LS spine negative for acute injury. Reviewed all results with daughter. Pt stable in ED with no significant deterioration in condition.The patient appears reasonably screened and/or stabilized for discharge and I doubt any other medical condition or other [facility] requiring further screening, evaluation, or treatment in the ED at this time prior to discharge.  MDM Reviewed: nursing note and vitals Interpretation: CT scan and x-ray     I  personally performed the services described in this documentation, which was scribed in my presence. The recorded information has been reviewed and considered.        Nicoletta Dress. Colon Branch, MD 11/03/11 1439

## 2011-11-03 NOTE — ED Notes (Signed)
Pt removed from backboard by edp 

## 2011-11-09 ENCOUNTER — Emergency Department (HOSPITAL_COMMUNITY)
Admission: EM | Admit: 2011-11-09 | Discharge: 2011-11-10 | Disposition: A | Discharge status: Nursing Home (Includes ICF) | Payer: Medicare Other | Attending: Emergency Medicine | Admitting: Emergency Medicine

## 2011-11-09 ENCOUNTER — Encounter (HOSPITAL_COMMUNITY): Payer: Self-pay | Admitting: Emergency Medicine

## 2011-11-09 ENCOUNTER — Emergency Department (HOSPITAL_COMMUNITY): Payer: Medicare Other

## 2011-11-09 DIAGNOSIS — F0390 Unspecified dementia without behavioral disturbance: Secondary | ICD-10-CM | POA: Insufficient documentation

## 2011-11-09 DIAGNOSIS — W06XXXA Fall from bed, initial encounter: Secondary | ICD-10-CM | POA: Insufficient documentation

## 2011-11-09 DIAGNOSIS — M810 Age-related osteoporosis without current pathological fracture: Secondary | ICD-10-CM | POA: Insufficient documentation

## 2011-11-09 DIAGNOSIS — R4181 Age-related cognitive decline: Secondary | ICD-10-CM | POA: Insufficient documentation

## 2011-11-09 DIAGNOSIS — W19XXXA Unspecified fall, initial encounter: Secondary | ICD-10-CM

## 2011-11-09 DIAGNOSIS — M549 Dorsalgia, unspecified: Secondary | ICD-10-CM | POA: Insufficient documentation

## 2011-11-09 DIAGNOSIS — Y9210 Unspecified residential institution as the place of occurrence of the external cause: Secondary | ICD-10-CM | POA: Insufficient documentation

## 2011-11-09 DIAGNOSIS — I10 Essential (primary) hypertension: Secondary | ICD-10-CM | POA: Insufficient documentation

## 2011-11-09 DIAGNOSIS — M129 Arthropathy, unspecified: Secondary | ICD-10-CM | POA: Insufficient documentation

## 2011-11-09 NOTE — ED Provider Notes (Signed)
History     CSN: [id number] Arrival date & time: 11/09/2011 10:05 PM   First MD Initiated Contact with Patient 11/09/11 2300      Chief Complaint  Patient presents with  . Fall    (Consider location/radiation/quality/duration/timing/severity/associated sxs/prior treatment) HPI Comments: Level 5 caveat applies due to dementia  [name] is a 77 y.o. male brought in by ambulance, who presents to the Emergency Department complaining of fall. Patient is resident of nursing home and fell out of bed. Nursing home staff found him on the floor. He had no c/o but was unable to get up on his own. EMS fully immobilized the patient for transport. Patient is unable to contribute to his history due to dementia. He has no c/o pain. H/o chronic back pain.  Patient is a 77 y.o. male presenting with fall. The history is provided by the EMS personnel, the nursing home and a relative.  Fall The accident occurred less than 1 hour ago. The fall occurred from a bed. He fell from a height of 1 to 2 ft. He landed on a hard floor. There was no blood loss. Point of impact: right side. Pain location: no focal area of pain. The patient is experiencing no pain. He was not ambulatory at the scene (patient is not ambulatory at the facility, uses a wheelchair). There was no entrapment after the fall.    Past Medical History  Diagnosis Date  . Dementia   . Hypertension   . Arthritis   . Osteoporosis     History reviewed. No pertinent past surgical history.  History reviewed. No pertinent family history.  History  Substance Use Topics  . Smoking status: Unknown If Ever Smoked  . Smokeless tobacco: Not on file  . Alcohol Use: No      Review of Systems  Unable to perform ROS: Dementia    Allergies  Review of patient's allergies indicates no known allergies.  Home Medications   Current Outpatient Rx  Name Route Sig Dispense Refill  . CALCIUM CITRATE 950 MG PO TABS Oral Take 1 tablet by mouth  daily.      Marland Kitchen CITALOPRAM HYDROBROMIDE 10 MG PO TABS Oral Take 10 mg by mouth daily.      Marland Kitchen CRANBERRY 425 MG PO CAPS Oral Take 1 capsule by mouth daily.      Marland Kitchen DOCUSATE SODIUM 100 MG PO CAPS Oral Take 100 mg by mouth 2 (two) times daily.      . MEGESTROL ACETATE 40 MG/ML PO SUSP Oral Take 200 mg by mouth daily.     . MELOXICAM 7.5 MG PO TABS Oral Take 7.5 mg by mouth daily.      Marland Kitchen METOPROLOL TARTRATE 25 MG PO TABS Oral Take 12.5 mg by mouth 2 (two) times daily.      Marland Kitchen ONE-DAILY MULTI VITAMINS PO TABS Oral Take 1 tablet by mouth daily.      Marland Kitchen PANTOPRAZOLE SODIUM 40 MG PO TBEC Oral Take 40 mg by mouth daily.      Marland Kitchen POTASSIUM CHLORIDE CRYS CR 20 MEQ PO TBCR Oral Take 20 mEq by mouth daily.     Marland Kitchen TAMSULOSIN HCL 0.4 MG PO CAPS Oral Take 0.4 mg by mouth daily.     . ACETAMINOPHEN 500 MG PO TABS Oral Take 500 mg by mouth every 6 (six) hours as needed.      Marland Kitchen HYPROMELLOSE 2.5 % OP SOLN Both Eyes Place 1 drop into both eyes 4 (four) times  daily as needed. Dry eyes     . MAGNESIUM HYDROXIDE 400 MG/5ML PO SUSP Oral Take 30 mLs by mouth daily as needed.      Marland Kitchen PROMETHAZINE HCL 25 MG PO TABS Oral Take 25 mg by mouth every 6 (six) hours as needed.        BP 133/75  Pulse 69  Temp(Src) 98.1 F (36.7 C) (Oral)  Resp 20  Ht 5\' 9"  (1.753 m)  Wt 180 lb (81.647 kg)  BMI 26.58 kg/m2  SpO2 97%  Physical Exam  Nursing note and vitals reviewed. Constitutional:       Frail elderly man in no acute distress.  HENT:  Head: Normocephalic and atraumatic.       Essentially endentulous. Teeth are all broken at the gumline, blackened. No abscesses noted.  Eyes: EOM are normal.  Neck: Normal range of motion.  Cardiovascular: Normal rate and normal heart sounds.   Pulmonary/Chest: Effort normal and breath sounds normal.  Abdominal: Soft.  Musculoskeletal: Normal range of motion. He exhibits no edema and no tenderness.       FROM at shoulders, elbows, wrists, hips, knees, ankles. Healed surgical scars to both  hips. Healing bruises to left upper thigh and hip. No pain to percussion of spine. Mild lumbar paraspinal muscle tenderness with palpation.  Neurological: He has normal reflexes.       Awake, alert, oriented to person. Thinks he is getting ready to go to work.    ED Course  Procedures (including critical care time)  Dg Chest Port 1 View  11/10/2011  *RADIOLOGY REPORT*  Clinical Data: Back pain status post fall, cough.  PORTABLE CHEST - 1 VIEW  Comparison: 04/08/2011  Findings: Hypoaeration results in interstitial crowding.  Mild linear right lung base opacity. Mild retrocardiac opacity.  No pleural effusion or pneumothorax.  Cardiomediastinal contours are unchanged allowing for differences in technique with mild tortuosity to the aorta.  No acute osseous abnormality identified however note that evaluation of the spine is limited by frontal portable technique.  IMPRESSION: Hypoaeration results in interstitial crowding.  Mild retrocardiac opacity may reflect atelectasis or infiltrate.  Original Report Authenticated By: Waneta Martins, M.D.    2311 Removed long spine board and c-collar.  MDM  Resident of nursing home who fell out of bed. No injuries noted. Chest xray normal. Pt stable in ED with no significant deterioration in condition.The patient appears reasonably screened and/or stabilized for discharge and I doubt any other medical condition or other [facility] requiring further screening, evaluation, or treatment in the ED at this time prior to discharge.  MDM Reviewed: previous chart, nursing note and vitals Reviewed previous: x-ray Interpretation: x-ray          Nicoletta Dress. Colon Branch, MD 11/10/11 [phone]

## 2011-11-09 NOTE — ED Notes (Signed)
Patient from [facility], was found in floor. Complaining of head, neck, and back pain before arrival to ED.

## 2011-11-10 NOTE — ED Notes (Signed)
Waiting on transport from  to take patient back home. Family member at bedside.

## 2011-11-16 ENCOUNTER — Emergency Department (HOSPITAL_COMMUNITY)
Admission: EM | Admit: 2011-11-16 | Discharge: 2011-11-16 | Disposition: A | Discharge status: Other Acute Inpatient Hospital | Payer: Medicare Other | Attending: Emergency Medicine | Admitting: Emergency Medicine

## 2011-11-16 ENCOUNTER — Emergency Department (HOSPITAL_COMMUNITY): Payer: Medicare Other

## 2011-11-16 ENCOUNTER — Encounter (HOSPITAL_COMMUNITY): Payer: Self-pay | Admitting: Emergency Medicine

## 2011-11-16 DIAGNOSIS — X58XXXA Exposure to other specified factors, initial encounter: Secondary | ICD-10-CM | POA: Insufficient documentation

## 2011-11-16 DIAGNOSIS — S32009A Unspecified fracture of unspecified lumbar vertebra, initial encounter for closed fracture: Secondary | ICD-10-CM | POA: Insufficient documentation

## 2011-11-16 DIAGNOSIS — M549 Dorsalgia, unspecified: Secondary | ICD-10-CM | POA: Insufficient documentation

## 2011-11-16 DIAGNOSIS — M25559 Pain in unspecified hip: Secondary | ICD-10-CM | POA: Insufficient documentation

## 2011-11-16 DIAGNOSIS — I10 Essential (primary) hypertension: Secondary | ICD-10-CM | POA: Insufficient documentation

## 2011-11-16 DIAGNOSIS — M47029 Vertebral artery compression syndromes, site unspecified: Secondary | ICD-10-CM

## 2011-11-16 DIAGNOSIS — Z79899 Other long term (current) drug therapy: Secondary | ICD-10-CM | POA: Insufficient documentation

## 2011-11-16 MED ORDER — TRAMADOL HCL 50 MG PO TABS
50.0000 mg | ORAL_TABLET | Freq: Once | ORAL | Status: AC
  Administered 2011-11-16: 50 mg via ORAL
  Filled 2011-11-16: qty 1

## 2011-11-16 MED ORDER — TRAMADOL HCL 50 MG PO TABS: 50.0000 mg | ORAL_TABLET | Freq: Three times a day (TID) | ORAL | Status: DC | PRN

## 2011-11-16 NOTE — Discharge Instructions (Signed)
 You have a compression fracture of L1 based on your xrays from 11/03/11.  It is important that Dr. Whitfield re-evaluate your pain from this injury and to consider different pain medication or possibly a minor surgical procedure that can be done by an interventionalist called vertebroplasty.  Only you, your family and your primary doctor can decide if this is right for you.

## 2011-11-16 NOTE — ED Provider Notes (Signed)
History   This chart was scribed for Gavin Pound. Oletta Lamas, MD by Clarita Crane. The patient was seen in room APA06/APA06 and the patient's care was started at 08:32.   CSN: [id number] Arrival date & time: 11/16/2011  8:21 AM   First MD Initiated Contact with Patient 11/16/11 0825      Chief Complaint  Patient presents with  . Back Pain     The history is limited by the condition of the patient.  [name] is a 77 y.o. male BIBA from [place] for back pain.  Patient experienced a fall about one week ago.  Per the nurse at [facility] patient was complaining of right hip and back pain while being transferred this morning.  Patient presently complains of upper back pain, but no neck pain and has no observed strength deficits.  Patient was also dx with UTI on last visit.   He has a h/o dementia.     Per the nurse was given tylenol prescribed vicodin two days ago since then reported somnolent.     Past Medical History  Diagnosis Date  . Dementia   . Hypertension   . Arthritis   . Osteoporosis     History reviewed. No pertinent past surgical history.  History reviewed. No pertinent family history.  History  Substance Use Topics  . Smoking status: Unknown If Ever Smoked  . Smokeless tobacco: Not on file  . Alcohol Use: No      Review of Systems  Unable to perform ROS: Dementia   10 Systems reviewed and are negative for acute change except as noted in the HPI.  Allergies  Review of patient's allergies indicates no known allergies.  Home Medications   Current Outpatient Rx  Name Route Sig Dispense Refill  . ACETAMINOPHEN 500 MG PO TABS Oral Take 500 mg by mouth every 6 (six) hours as needed.      Marland Kitchen CALCIUM CITRATE 950 MG PO TABS Oral Take 1 tablet by mouth daily.      Marland Kitchen CITALOPRAM HYDROBROMIDE 10 MG PO TABS Oral Take 10 mg by mouth daily.      Marland Kitchen CRANBERRY 425 MG PO CAPS Oral Take 1 capsule by mouth daily.      Marland Kitchen DOCUSATE SODIUM 100 MG PO CAPS Oral Take 100 mg  by mouth 2 (two) times daily.      Marland Kitchen HYPROMELLOSE 2.5 % OP SOLN Both Eyes Place 1 drop into both eyes 4 (four) times daily as needed. Dry eyes     . MAGNESIUM HYDROXIDE 400 MG/5ML PO SUSP Oral Take 30 mLs by mouth daily as needed.      . MEGESTROL ACETATE 40 MG/ML PO SUSP Oral Take 200 mg by mouth daily.     . MELOXICAM 7.5 MG PO TABS Oral Take 7.5 mg by mouth daily.      Marland Kitchen METOPROLOL TARTRATE 25 MG PO TABS Oral Take 12.5 mg by mouth 2 (two) times daily.      Marland Kitchen ONE-DAILY MULTI VITAMINS PO TABS Oral Take 1 tablet by mouth daily.      Marland Kitchen PANTOPRAZOLE SODIUM 40 MG PO TBEC Oral Take 40 mg by mouth daily.      Marland Kitchen POTASSIUM CHLORIDE CRYS CR 20 MEQ PO TBCR Oral Take 20 mEq by mouth daily.     Marland Kitchen PROMETHAZINE HCL 25 MG PO TABS Oral Take 25 mg by mouth every 6 (six) hours as needed.      Marland Kitchen TAMSULOSIN HCL 0.4 MG  PO CAPS Oral Take 0.4 mg by mouth daily.     . TRAMADOL HCL 50 MG PO TABS Oral Take 1 tablet (50 mg total) by mouth every 8 (eight) hours as needed for pain. Maximum dose= 8 tablets per day 15 tablet 0    BP 120/80  Pulse 78  Temp 97.9 F (36.6 C)  Resp 20  Ht 6' (1.829 m)  Wt 175 lb (79.379 kg)  BMI 23.73 kg/m2  SpO2 97%  Physical Exam  Nursing note and vitals reviewed. Constitutional: He appears well-developed and well-nourished. No distress.  HENT:  Head: Normocephalic and atraumatic.       Mucous membranes dry  Eyes: EOM are normal.  Neck: Neck supple. No tracheal deviation present.  Cardiovascular: Normal rate, regular rhythm, normal heart sounds and intact distal pulses.   No murmur heard. Pulmonary/Chest: Effort normal and breath sounds normal. No respiratory distress. He has no wheezes. He has no rales.  Abdominal: Soft. He exhibits no distension.  Musculoskeletal: Normal range of motion. He exhibits no edema.       Thoracic back: He exhibits no spasm.       Lumbar back: He exhibits no spasm.       Mild diffuse tenderness mid thoracic to lumbar.  Neurological: He is alert.  No sensory deficit.       4 out of 5 strength right and left legs. 5/5 strength upper extremities. Normal sensation of lower extremities bilaterally.  Skin: Skin is warm and dry. No rash noted.  Psychiatric: He has a normal mood and affect. His behavior is normal.    ED Course  Procedures   DIAGNOSTIC STUDIES: Oxygen Saturation is 99% on room air, normal by my interpretation.    COORDINATION OF CARE: 8:45 AM Consult complete with [employer]. Discussed compression fracture of L1 with nurse at [facility] who was unaware of fracture.  Discussed pain management.   9:06 AM Discussion with patient's family member regarding course of car. 9:49 AM  Nurse reports patient "tensed up" when moved.   10:17 AM Consult complete with Dr. Regino Schultze. Patient case explained and discussed. Dr. Regino Schultze recommends obtaining CT for further evaluation.   Labs Reviewed - No data to display Ct Lumbar Spine Wo Contrast  11/16/2011  *RADIOLOGY REPORT*  Clinical Data:  NUMEROUS RECENT FALLS.  PAIN.  HISTORY OF BILATERAL HIP SURGERY.  UNABLE TO WALK.  CT LUMBAR SPINE WITHOUT CONTRAST  Technique:  Multidetector CT imaging of the lumbar spine was performed without intravenous contrast administration.  Multiplanar CT image reconstructions were also generated.  Comparison:  Plain film lumbar spine of 11/03/2011.  Findings:  Spinal imaging from the bottom of T11 through the upper sacrum.  Soft tissue windows demonstrate vascular calcifications medial of the bilateral kidneys.  Suspect a left extrarenal pelvis with nonspecific surrounding peripelvic edema on image 47 of series 4. No retroperitoneal or paraspinous hemorrhage.  Bone windows demonstrate moderate osteopenia.  Mild T12 compression deformity without significant canal compromise. This is felt to be new or progressive since 11/03/2011 (but better imaged today).  A moderate L1 compression deformity is also similar to on the prior exam.  Approximately 4 mm of  canal encroachment on image 32 of series 6 sagittal.  No extension into the posterior elements.  Remainder of vertebral body height within normal limits for age.  Relatively mild spondylosis at other lumbar levels.  IMPRESSION: Moderate L1 compression deformity with 4 mm of ventral canal encroachment.  Similar to 11/03/2011.  Mild T12 compression deformity, felt to be new or progressive since 11/03/2011 plain film.  Osteopenia.  CT PELVIS WITHOUT CONTRAST  Technique:  Multidetector CT imaging of the pelvis was performed following the standard protocol without intravenous contrast.  Findings:  Soft tissue windows demonstrate beam hardening artifact from right hip arthroplasty and left proximal femoral fixation. Probable prostatomegaly.  Bone windows demonstrate remote left proximal femoral fracture. Moderate osteopenia.  Right hip arthroplasty.  No acute hardware complication identified.  No acute fracture.  Remote right inferior pubic ramus fracture.  IMPRESSION: Osteopenia without acute osseous abnormality about the pelvis.  Degraded by beam hardening artifact from bilateral hip hardware.  Original Report Authenticated By: Consuello Bossier, M.D.   Ct Pelvis Wo Contrast  11/16/2011  *RADIOLOGY REPORT*  Clinical Data:  NUMEROUS RECENT FALLS.  PAIN.  HISTORY OF BILATERAL HIP SURGERY.  UNABLE TO WALK.  CT LUMBAR SPINE WITHOUT CONTRAST  Technique:  Multidetector CT imaging of the lumbar spine was performed without intravenous contrast administration.  Multiplanar CT image reconstructions were also generated.  Comparison:  Plain film lumbar spine of 11/03/2011.  Findings:  Spinal imaging from the bottom of T11 through the upper sacrum.  Soft tissue windows demonstrate vascular calcifications medial of the bilateral kidneys.  Suspect a left extrarenal pelvis with nonspecific surrounding peripelvic edema on image 47 of series 4. No retroperitoneal or paraspinous hemorrhage.  Bone windows demonstrate moderate osteopenia.   Mild T12 compression deformity without significant canal compromise. This is felt to be new or progressive since 11/03/2011 (but better imaged today).  A moderate L1 compression deformity is also similar to on the prior exam.  Approximately 4 mm of canal encroachment on image 32 of series 6 sagittal.  No extension into the posterior elements.  Remainder of vertebral body height within normal limits for age.  Relatively mild spondylosis at other lumbar levels.  IMPRESSION: Moderate L1 compression deformity with 4 mm of ventral canal encroachment.  Similar to 11/03/2011.  Mild T12 compression deformity, felt to be new or progressive since 11/03/2011 plain film.  Osteopenia.  CT PELVIS WITHOUT CONTRAST  Technique:  Multidetector CT imaging of the pelvis was performed following the standard protocol without intravenous contrast.  Findings:  Soft tissue windows demonstrate beam hardening artifact from right hip arthroplasty and left proximal femoral fixation. Probable prostatomegaly.  Bone windows demonstrate remote left proximal femoral fracture. Moderate osteopenia.  Right hip arthroplasty.  No acute hardware complication identified.  No acute fracture.  Remote right inferior pubic ramus fracture.  IMPRESSION: Osteopenia without acute osseous abnormality about the pelvis.  Degraded by beam hardening artifact from bilateral hip hardware.  Original Report Authenticated By: Consuello Bossier, M.D.     No diagnosis found.    MDM  I reviewed patient's prior records as well as prior radiographs. The patient has a L1 vertebral fracture with 40% loss of height noted on plain films from November 12. It is unclear if these are acute or chronic findings. I suspect that evening this fracture is chronic, it appears to be causing him pain. Family is now here in despite what the nurse at his facility told me, he is being given Vicodin over the last 2 days. Unfortunately it seems as though when he takes it he becomes very  somnolent and develops a poor appetite and family notes he is not eating and drinking well. He has been seen by his primary care physician recently and told that he a urinary tract infection and antibiotics  were prescribed, but no pain medications. Family reports that often with trying to stand or transitioning to a wheelchair causes him significant pain and she continues to be concerned about his pelvis and hips. On repeat examination, he still has very good range of motion of both hips with no pain or tenderness on examination. His pelvis is stable. I cannot elicit pain in his tenderness with AP or lateral compression. My plan is to attempt to get him to stand here with staff with assistance to see if pain is reproducible. If it is my plan is to obtain radiographs of those areas as indicated. At this point there is no indication for plain films as I cannot elicit any kind of pain with range of motion of his lower pelvis. He has not suffered any new falls that is known to either the nurse at this facility or his family member. The patient only complains of mild pain to his diffuse back with palpation with no obvious deformities. He is intact distally neurologically.     I personally performed the services described in this documentation, which was scribed in my presence. The recorded information has been reviewed and considered.  10:30 AM I discussed pt's care with Dr. Regino Schultze who is made aware of L1 compression and need for better pain management and possibly interventional radiology procedure pending CT scans.     11:00 AM I reviewed pt's CT scans.  No acute fractures.  L1 and T 12 mild compression noted.  Can be discharged back to facility.  Will recommend limited mobility, will recommend brace for back and Dr. Regino Schultze can arrange radiology follow up  Gavin Pound. Johnice Riebe, MD 11/16/11 1101

## 2011-11-16 NOTE — ED Notes (Signed)
Pt c/o continued left flank/hip pain x 1 week.  Pt seen in ed last week for fall and dx with uti. nad noted.

## 2011-11-16 NOTE — ED Notes (Signed)
Pt family said pt has not walked in a while

## 2011-11-18 ENCOUNTER — Inpatient Hospital Stay (HOSPITAL_COMMUNITY)
Admission: EM | Admit: 2011-11-18 | Discharge: 2011-11-20 | DRG: 683 | Disposition: A | Discharge status: Skilled Nursing Facility | Payer: Medicare Other | Attending: Internal Medicine | Admitting: Internal Medicine

## 2011-11-18 ENCOUNTER — Encounter (HOSPITAL_COMMUNITY): Payer: Self-pay

## 2011-11-18 ENCOUNTER — Emergency Department (HOSPITAL_COMMUNITY): Payer: Medicare Other

## 2011-11-18 DIAGNOSIS — M549 Dorsalgia, unspecified: Secondary | ICD-10-CM

## 2011-11-18 DIAGNOSIS — J449 Chronic obstructive pulmonary disease, unspecified: Secondary | ICD-10-CM

## 2011-11-18 DIAGNOSIS — M545 Low back pain, unspecified: Secondary | ICD-10-CM | POA: Diagnosis present

## 2011-11-18 DIAGNOSIS — I10 Essential (primary) hypertension: Secondary | ICD-10-CM

## 2011-11-18 DIAGNOSIS — N179 Acute kidney failure, unspecified: Principal | ICD-10-CM

## 2011-11-18 DIAGNOSIS — F32A Depression, unspecified: Secondary | ICD-10-CM

## 2011-11-18 DIAGNOSIS — S22009A Unspecified fracture of unspecified thoracic vertebra, initial encounter for closed fracture: Secondary | ICD-10-CM | POA: Diagnosis present

## 2011-11-18 DIAGNOSIS — R627 Adult failure to thrive: Secondary | ICD-10-CM | POA: Diagnosis present

## 2011-11-18 DIAGNOSIS — Y9210 Unspecified residential institution as the place of occurrence of the external cause: Secondary | ICD-10-CM | POA: Diagnosis present

## 2011-11-18 DIAGNOSIS — S32009A Unspecified fracture of unspecified lumbar vertebra, initial encounter for closed fracture: Secondary | ICD-10-CM | POA: Diagnosis present

## 2011-11-18 DIAGNOSIS — IMO0002 Reserved for concepts with insufficient information to code with codable children: Secondary | ICD-10-CM | POA: Diagnosis present

## 2011-11-18 DIAGNOSIS — M76899 Other specified enthesopathies of unspecified lower limb, excluding foot: Secondary | ICD-10-CM

## 2011-11-18 DIAGNOSIS — F329 Major depressive disorder, single episode, unspecified: Secondary | ICD-10-CM | POA: Diagnosis present

## 2011-11-18 DIAGNOSIS — S72143A Displaced intertrochanteric fracture of unspecified femur, initial encounter for closed fracture: Secondary | ICD-10-CM

## 2011-11-18 DIAGNOSIS — E860 Dehydration: Secondary | ICD-10-CM

## 2011-11-18 DIAGNOSIS — F068 Other specified mental disorders due to known physiological condition: Secondary | ICD-10-CM | POA: Diagnosis present

## 2011-11-18 DIAGNOSIS — F3289 Other specified depressive episodes: Secondary | ICD-10-CM | POA: Diagnosis present

## 2011-11-18 DIAGNOSIS — N400 Enlarged prostate without lower urinary tract symptoms: Secondary | ICD-10-CM

## 2011-11-18 DIAGNOSIS — D649 Anemia, unspecified: Secondary | ICD-10-CM

## 2011-11-18 DIAGNOSIS — W06XXXA Fall from bed, initial encounter: Secondary | ICD-10-CM | POA: Diagnosis present

## 2011-11-18 DIAGNOSIS — Z66 Do not resuscitate: Secondary | ICD-10-CM | POA: Diagnosis present

## 2011-11-18 DIAGNOSIS — F0390 Unspecified dementia without behavioral disturbance: Secondary | ICD-10-CM

## 2011-11-18 DIAGNOSIS — N289 Disorder of kidney and ureter, unspecified: Secondary | ICD-10-CM

## 2011-11-18 HISTORY — DX: Reserved for concepts with insufficient information to code with codable children: IMO0002

## 2011-11-18 HISTORY — DX: Unspecified multiple injuries, initial encounter: T07.XXXA

## 2011-11-18 HISTORY — DX: Major depressive disorder, single episode, unspecified: F32.9

## 2011-11-18 HISTORY — DX: Benign prostatic hyperplasia without lower urinary tract symptoms: N40.0

## 2011-11-18 LAB — CBC
MCH: 30.6 pg (ref 26.0–34.0)
MCHC: 33.1 g/dL (ref 30.0–36.0)
MCV: 92.4 fL (ref 78.0–100.0)
Platelets: 257 10*3/uL (ref 150–400)
RBC: 4.22 MIL/uL (ref 4.22–5.81)

## 2011-11-18 LAB — DIFFERENTIAL
Basophils Relative: 0 % (ref 0–1)
Eosinophils Absolute: 0.1 10*3/uL (ref 0.0–0.7)
Eosinophils Relative: 1 % (ref 0–5)
Lymphs Abs: 1 10*3/uL (ref 0.7–4.0)
Monocytes Relative: 10 % (ref 3–12)

## 2011-11-18 LAB — COMPREHENSIVE METABOLIC PANEL
Albumin: 3.3 g/dL — ABNORMAL LOW (ref 3.5–5.2)
BUN: 52 mg/dL — ABNORMAL HIGH (ref 6–23)
Calcium: 11.4 mg/dL — ABNORMAL HIGH (ref 8.4–10.5)
GFR calc Af Amer: 55 mL/min — ABNORMAL LOW (ref >90)
Glucose, Bld: 149 mg/dL — ABNORMAL HIGH (ref 70–99)
Sodium: 140 mEq/L (ref 135–145)
Total Protein: 7.8 g/dL (ref 6.0–8.3)

## 2011-11-18 LAB — URINALYSIS, ROUTINE W REFLEX MICROSCOPIC
Leukocytes, UA: NEGATIVE
Nitrite: NEGATIVE
Specific Gravity, Urine: >1.03 — ABNORMAL HIGH (ref 1.005–1.030)
pH: 5.5 (ref 5.0–8.0)

## 2011-11-18 MED ORDER — SODIUM CHLORIDE 0.9 % IV SOLN: Freq: Once | INTRAVENOUS | Status: DC

## 2011-11-18 MED ORDER — SODIUM CHLORIDE 0.9 % IV BOLUS (SEPSIS)
500.0000 mL | Freq: Once | INTRAVENOUS | Status: AC
  Administered 2011-11-18: 500 mL via INTRAVENOUS

## 2011-11-18 MED ORDER — ONDANSETRON HCL 4 MG/2ML IJ SOLN
4.0000 mg | Freq: Once | INTRAMUSCULAR | Status: AC
  Administered 2011-11-18: 4 mg via INTRAVENOUS
  Filled 2011-11-18: qty 2

## 2011-11-18 MED ORDER — MORPHINE SULFATE 4 MG/ML IJ SOLN
2.0000 mg | Freq: Once | INTRAMUSCULAR | Status: AC
  Administered 2011-11-18: 2 mg via INTRAVENOUS
  Filled 2011-11-18: qty 1

## 2011-11-18 NOTE — ED Provider Notes (Signed)
History   This chart was scribed for [employer]. Colon Branch, MD by Clarita Crane. The patient was seen in room APAH4/APAH4 and the patient's care was started at 9:20 PM.   CSN: [id number] Arrival date & time: 11/18/2011  7:59 PM   First MD Initiated Contact with Patient 11/18/11 2102      Chief Complaint  Patient presents with  . Back Pain  . Fall     (Consider location/radiation/quality/duration/timing/severity/associated sxs/prior treatment) HPI Level 5 caveat applies due to pts dementia.  [name] is a 77 y.o. male who presents to the Emergency Department complaining of back pain secondary to fall several days ago. History was provided by daughter. Daughter reports pt has a loss of appetite and decreased fluid intake. Daughter also stated pt said "I feel like I am dying." Daughter also states pt has been on pain medications with little relief. Daughter reports pt has chronic back pain, but has been increasingly worse since the fall.    Past Medical History  Diagnosis Date  . Dementia   . Hypertension   . Arthritis   . Osteoporosis     No past surgical history on file.  No family history on file.  History  Substance Use Topics  . Smoking status: Unknown If Ever Smoked  . Smokeless tobacco: Not on file  . Alcohol Use: No      Review of Systems  Unable to perform ROS: Dementia    Allergies  Review of patient's allergies indicates no known allergies.  Home Medications   Current Outpatient Rx  Name Route Sig Dispense Refill  . ACETAMINOPHEN 500 MG PO TABS Oral Take 500 mg by mouth every 6 (six) hours as needed.      Marland Kitchen CALCIUM CITRATE-VITAMIN D 200-250 MG-UNIT PO TABS Oral Take 2 tablets by mouth 2 (two) times daily.      Marland Kitchen CITALOPRAM HYDROBROMIDE 10 MG PO TABS Oral Take 10 mg by mouth daily.      Marland Kitchen CRANBERRY 425 MG PO CAPS Oral Take 1 capsule by mouth daily.      Marland Kitchen DOCUSATE SODIUM 100 MG PO CAPS Oral Take 100 mg by mouth 2 (two) times daily.      Marland Kitchen ENSURE PO  LIQD Oral Take 237 mLs by mouth 3 (three) times daily.      Marland Kitchen HYDROCODONE-ACETAMINOPHEN 5-500 MG PO TABS Oral Take 1 tablet by mouth every 6 (six) hours as needed. For pain     . LEVOFLOXACIN 750 MG PO TABS Oral Take 750 mg by mouth daily. Completed 11/16/2011     . MEGESTROL ACETATE 40 MG/ML PO SUSP Oral Take 200 mg by mouth daily.     . MELOXICAM 7.5 MG PO TABS Oral Take 7.5 mg by mouth daily.      Marland Kitchen METOPROLOL TARTRATE 25 MG PO TABS Oral Take 12.5 mg by mouth 2 (two) times daily.      Marland Kitchen ONE-DAILY MULTI VITAMINS PO TABS Oral Take 1 tablet by mouth daily. CHEWABLE: [employer] Complete Multivitamin    . PANTOPRAZOLE SODIUM 40 MG PO TBEC Oral Take 40 mg by mouth daily.      Marland Kitchen POTASSIUM CHLORIDE CRYS CR 20 MEQ PO TBCR Oral Take 20 mEq by mouth daily.     Marland Kitchen TAMSULOSIN HCL 0.4 MG PO CAPS Oral Take 0.4 mg by mouth daily.     . TRAMADOL HCL 50 MG PO TABS Oral Take 1 tablet (50 mg total) by mouth every 8 (eight) hours  as needed for pain. Maximum dose= 8 tablets per day 15 tablet 0    BP 135/115  Pulse 84  Temp(Src) 98.1 F (36.7 C) (Oral)  Resp 20  SpO2 98%  Physical Exam  Constitutional: He is oriented to person, place, and time. He appears well-developed and well-nourished.  HENT:  Head: Normocephalic and atraumatic.       Dentition is very poor and mucous membranes are very dry. Dry pill residue and matter remaining in his mouth  Eyes: Conjunctivae and EOM are normal. Pupils are equal, round, and reactive to light.  Neck: Normal range of motion. Neck supple. No JVD present.  Cardiovascular: Normal rate, regular rhythm and normal heart sounds.   Pulmonary/Chest: Effort normal. No respiratory distress. He has no wheezes. He has rales.       Rales are present bilaterally in lung bases. Course upper airway sounds.  Musculoskeletal: Normal range of motion. He exhibits no edema.       Nl DP and PT   Neurological: He is alert and oriented to person, place, and time.  Skin: Skin is warm and  dry.  Psychiatric: He has a normal mood and affect. His behavior is normal.       Depressed affect    ED Course  Procedures (including critical care time) 9:26 PM: Relatives have been informed of pts new T12 fracture   DIAGNOSTIC STUDIES: Oxygen Saturation is 98% on room air, normal by my interpretation.    COORDINATION OF CARE:    Labs Reviewed  CBC - Abnormal; Notable for the following:    Hemoglobin 12.9 (*)    All other components within normal limits  DIFFERENTIAL - Abnormal; Notable for the following:    Neutrophils Relative 79 (*)    Neutro Abs 7.9 (*)    Lymphocytes Relative 10 (*)    All other components within normal limits  COMPREHENSIVE METABOLIC PANEL - Abnormal; Notable for the following:    Glucose, Bld 149 (*)    BUN 52 (*)    Calcium 11.4 (*)    Albumin 3.3 (*)    GFR calc non Af Amer 48 (*)    GFR calc Af Amer 55 (*)    All other components within normal limits  URINALYSIS, ROUTINE W REFLEX MICROSCOPIC - Abnormal; Notable for the following:    Specific Gravity, Urine >1.030 (*)    Ketones, ur TRACE (*)    All other components within normal limits   Dg Chest Port 1 View  11/18/2011  *RADIOLOGY REPORT*  Clinical Data: Back pain.  PORTABLE CHEST - 1 VIEW  Comparison: 11/09/2011  Findings: Chronic scarring in both lung bases.  No evidence of edema, infiltrate, consolidation, pleural effusion or pneumothorax. The heart size is stable.  No fractures visualized.  IMPRESSION: No acute findings.  Chronic scarring at both lung bases.  Original Report Authenticated By: Reola Calkins, M.D.       MDM  Patient is a resident of [facility] here with persistent back pain and generalized malaise. Labs with concentrated urine , elevated BUN and renal insufficieny , dry mucus membranes c/w dehydration. Initiated IVFs, analgesics.  2355 Spoke with Dr. Rito Ehrlich who will admit the patient. The patient appears reasonably stabilized for admission considering the  current resources, flow, and capabilities available in the ED at this time, and I doubt any other [facility] requiring further screening and/or treatment in the ED prior to admission. MDM Reviewed: nursing note, vitals and previous chart Reviewed previous: labs  and x-ray Interpretation: labs and x-ray  I personally performed the services described in this documentation, which was scribed in my presence. The recorded information has been reviewed and considered.         Nicoletta Dress. Colon Branch, MD 11/19/11 1610

## 2011-11-18 NOTE — ED Notes (Signed)
Pt from [place] fell a few days ago, cont to have lower back pain.  [facility] was called d/t Pt with pain and increased sleepiness.

## 2011-11-18 NOTE — ED Notes (Signed)
Patient resting comfortably in bed with eyes closed and knees drawn up.  Family remains at bedside.

## 2011-11-18 NOTE — ED Notes (Signed)
Family reports that the pt has "just not been acting himself today" family reports that the pt has been talking about dying and has stopped eating.  Family reports that the pt is "probably dehydrated". Pt is alert, and c/o lower back pain.

## 2011-11-19 ENCOUNTER — Encounter (HOSPITAL_COMMUNITY): Payer: Self-pay

## 2011-11-19 DIAGNOSIS — IMO0002 Reserved for concepts with insufficient information to code with codable children: Secondary | ICD-10-CM | POA: Diagnosis present

## 2011-11-19 DIAGNOSIS — N179 Acute kidney failure, unspecified: Secondary | ICD-10-CM | POA: Diagnosis present

## 2011-11-19 DIAGNOSIS — E860 Dehydration: Secondary | ICD-10-CM | POA: Diagnosis present

## 2011-11-19 DIAGNOSIS — F0390 Unspecified dementia without behavioral disturbance: Secondary | ICD-10-CM | POA: Diagnosis present

## 2011-11-19 DIAGNOSIS — R627 Adult failure to thrive: Secondary | ICD-10-CM | POA: Diagnosis present

## 2011-11-19 DIAGNOSIS — N400 Enlarged prostate without lower urinary tract symptoms: Secondary | ICD-10-CM | POA: Diagnosis present

## 2011-11-19 DIAGNOSIS — D649 Anemia, unspecified: Secondary | ICD-10-CM | POA: Diagnosis present

## 2011-11-19 LAB — COMPREHENSIVE METABOLIC PANEL
Albumin: 3 g/dL — ABNORMAL LOW (ref 3.5–5.2)
Alkaline Phosphatase: 107 U/L (ref 39–117)
BUN: 50 mg/dL — ABNORMAL HIGH (ref 6–23)
CO2: 24 mEq/L (ref 19–32)
Chloride: 112 mEq/L (ref 96–112)
Creatinine, Ser: 1.12 mg/dL (ref 0.50–1.35)
GFR calc Af Amer: 64 mL/min — ABNORMAL LOW (ref >90)
GFR calc non Af Amer: 55 mL/min — ABNORMAL LOW (ref >90)
Glucose, Bld: 118 mg/dL — ABNORMAL HIGH (ref 70–99)
Potassium: 4 mEq/L (ref 3.5–5.1)
Total Bilirubin: 0.8 mg/dL (ref 0.3–1.2)

## 2011-11-19 LAB — CBC
MCV: 93.3 fL (ref 78.0–100.0)
Platelets: 244 10*3/uL (ref 150–400)
RBC: 4.01 MIL/uL — ABNORMAL LOW (ref 4.22–5.81)
RDW: 13.6 % (ref 11.5–15.5)
WBC: 8.6 10*3/uL (ref 4.0–10.5)

## 2011-11-19 LAB — MRSA PCR SCREENING: MRSA by PCR: INVALID — AB

## 2011-11-19 MED ORDER — ONDANSETRON HCL 4 MG/2ML IJ SOLN: 4.0000 mg | Freq: Four times a day (QID) | INTRAMUSCULAR | Status: DC | PRN

## 2011-11-19 MED ORDER — POLYETHYLENE GLYCOL 3350 17 G PO PACK
17.0000 g | PACK | Freq: Every day | ORAL | Status: DC
  Administered 2011-11-19 – 2011-11-20 (×2): 17 g via ORAL
  Filled 2011-11-19 (×2): qty 1

## 2011-11-19 MED ORDER — CITALOPRAM HYDROBROMIDE 20 MG PO TABS
10.0000 mg | ORAL_TABLET | Freq: Every day | ORAL | Status: DC
  Administered 2011-11-19 – 2011-11-20 (×2): 10 mg via ORAL
  Filled 2011-11-19 (×2): qty 1

## 2011-11-19 MED ORDER — TAMSULOSIN HCL 0.4 MG PO CAPS
0.4000 mg | ORAL_CAPSULE | Freq: Every day | ORAL | Status: DC
  Administered 2011-11-19 – 2011-11-20 (×2): 0.4 mg via ORAL
  Filled 2011-11-19 (×2): qty 1

## 2011-11-19 MED ORDER — ALBUTEROL SULFATE (5 MG/ML) 0.5% IN NEBU: 2.5000 mg | INHALATION_SOLUTION | RESPIRATORY_TRACT | Status: DC | PRN

## 2011-11-19 MED ORDER — OXYCODONE HCL 5 MG PO TABS: 5.0000 mg | ORAL_TABLET | ORAL | Status: DC | PRN

## 2011-11-19 MED ORDER — HYDROCODONE-ACETAMINOPHEN 5-325 MG PO TABS: 1.0000 | ORAL_TABLET | Freq: Four times a day (QID) | ORAL | Status: DC | PRN

## 2011-11-19 MED ORDER — DOCUSATE SODIUM 100 MG PO CAPS
100.0000 mg | ORAL_CAPSULE | Freq: Two times a day (BID) | ORAL | Status: DC
  Administered 2011-11-19 – 2011-11-20 (×4): 100 mg via ORAL
  Filled 2011-11-19 (×4): qty 1

## 2011-11-19 MED ORDER — ONDANSETRON HCL 4 MG PO TABS: 4.0000 mg | ORAL_TABLET | Freq: Four times a day (QID) | ORAL | Status: DC | PRN

## 2011-11-19 MED ORDER — METOPROLOL TARTRATE 25 MG PO TABS
12.5000 mg | ORAL_TABLET | Freq: Two times a day (BID) | ORAL | Status: DC
  Administered 2011-11-19 – 2011-11-20 (×4): 12.5 mg via ORAL
  Filled 2011-11-19 (×4): qty 1

## 2011-11-19 MED ORDER — POTASSIUM CHLORIDE IN NACL 20-0.9 MEQ/L-% IV SOLN
INTRAVENOUS | Status: DC
  Administered 2011-11-19 – 2011-11-20 (×2): via INTRAVENOUS

## 2011-11-19 MED ORDER — ENSURE CLINICAL ST REVIGOR PO LIQD
237.0000 mL | Freq: Three times a day (TID) | ORAL | Status: DC
  Administered 2011-11-19 – 2011-11-20 (×3): 237 mL via ORAL
  Filled 2011-11-19 (×4): qty 237

## 2011-11-19 MED ORDER — ACETAMINOPHEN 325 MG PO TABS
650.0000 mg | ORAL_TABLET | Freq: Four times a day (QID) | ORAL | Status: DC | PRN
  Administered 2011-11-20: 650 mg via ORAL
  Filled 2011-11-19: qty 2

## 2011-11-19 MED ORDER — PANTOPRAZOLE SODIUM 40 MG PO TBEC
40.0000 mg | DELAYED_RELEASE_TABLET | Freq: Every day | ORAL | Status: DC
  Administered 2011-11-19 – 2011-11-20 (×2): 40 mg via ORAL
  Filled 2011-11-19 (×2): qty 1

## 2011-11-19 MED ORDER — MEGESTROL ACETATE 400 MG/10ML PO SUSP
200.0000 mg | Freq: Every day | ORAL | Status: DC
  Administered 2011-11-19 – 2011-11-20 (×2): 200 mg via ORAL
  Filled 2011-11-19: qty 10
  Filled 2011-11-19: qty 5
  Filled 2011-11-19: qty 10

## 2011-11-19 NOTE — H&P (Signed)
[name] is an 77 y.o. male.    PCP: Kirk Ruths, MD   Chief Complaint: Back pain  HPI: This is a 77 year old, Caucasian male, who lives in [facility] in the assisted living facility. Patient, according to his daughter, was in his usual state of health till about a month ago, when he was getting out of bed, and fell. According to the daughter he is pretty limited functionally and is wheelchair-bound. However, because of his dementia he sometimes tries to get out of bed himself to go to the bathroom. As a result of these falls he had back pain. He was found to have compression fractures in the ED a week ago. He was given pain medications. However, over the last few days he has not been able to eat or drink much. His pain level may not be adequately controlled. And, today he commented to one of the staff at the [facility] that he felt like he was going to die. So, the nurse send the patient to the emergency department. Patient has been given pain medications and plus he has dementia and so no history was available from him. Most of the history was given by the daughter, or was obtained from the ED notes. Apparently, there is also some mention that his mental status may also be altered.   Prior to Admission medications   Medication Sig Start Date End Date Taking? Authorizing Provider  acetaminophen (TYLENOL) 500 MG tablet Take 500 mg by mouth every 6 (six) hours as needed.     Yes Historical Provider, MD  Calcium Citrate-Vitamin D (CITRACAL PETITES/VITAMIN D) 200-250 MG-UNIT TABS Take 2 tablets by mouth 2 (two) times daily.     Yes Historical Provider, MD  citalopram (CELEXA) 10 MG tablet Take 10 mg by mouth daily.     Yes Historical Provider, MD  Cranberry 425 MG CAPS Take 1 capsule by mouth daily.     Yes Historical Provider, MD  docusate sodium (COLACE) 100 MG capsule Take 100 mg by mouth 2 (two) times daily.     Yes Historical Provider, MD  ENSURE (ENSURE) Take 237 mLs by mouth 3  (three) times daily.     Yes Historical Provider, MD  HYDROcodone-acetaminophen (VICODIN) 5-500 MG per tablet Take 1 tablet by mouth every 6 (six) hours as needed. For pain    Yes Historical Provider, MD  levofloxacin (LEVAQUIN) 750 MG tablet Take 750 mg by mouth daily. Completed 11/16/2011  11/12/11  Yes Historical Provider, MD  megestrol (MEGACE) 40 MG/ML suspension Take 200 mg by mouth daily.    Yes Historical Provider, MD  meloxicam (MOBIC) 7.5 MG tablet Take 7.5 mg by mouth daily.     Yes Historical Provider, MD  metoprolol tartrate (LOPRESSOR) 25 MG tablet Take 12.5 mg by mouth 2 (two) times daily.     Yes Historical Provider, MD  Multiple Vitamin (MULTIVITAMIN) tablet Take 1 tablet by mouth daily. CHEWABLE: [employer] Complete Multivitamin   Yes Historical Provider, MD  pantoprazole (PROTONIX) 40 MG tablet Take 40 mg by mouth daily.     Yes Historical Provider, MD  potassium chloride SA (K-DUR,KLOR-CON) 20 MEQ tablet Take 20 mEq by mouth daily.    Yes Historical Provider, MD  Tamsulosin HCl (FLOMAX) 0.4 MG CAPS Take 0.4 mg by mouth daily.    Yes Historical Provider, MD  traMADol (ULTRAM) 50 MG tablet Take 1 tablet (50 mg total) by mouth every 8 (eight) hours as needed for pain. Maximum dose=  8 tablets per day 11/16/11 11/26/11 Yes Gavin Pound. Ghim, MD    Allergies: No Known Allergies  Past Medical History  Diagnosis Date  . Dementia   . Hypertension   . Arthritis   . Osteoporosis     No past surgical history on file.  Social History:  has an unknown smoking status. He does not have any smokeless tobacco history on file. He reports that he does not drink alcohol or use illicit drugs.  Family History: Unable to verify due to dementia  Review of Systems unable to do due to dementia  Physical Examination Blood pressure 135/87, pulse 84, temperature 98.1 F (36.7 C), temperature source Oral, resp. rate 20, SpO2 98.00%.  General appearance: distracted, no distress and slowed  mentation Head: Normocephalic, without obvious abnormality, atraumatic Eyes: conjunctivae/corneas clear. PERRL, EOM's intact. Fundi benign. Throat: lips, mucosa, and tongue normal; teeth and gums normal Neck: no adenopathy, no carotid bruit, no JVD, supple, symmetrical, trachea midline and thyroid not enlarged, symmetric, no tenderness/mass/nodules Resp: clear to auscultation bilaterally Cardio: regular rate and rhythm, S1, S2 normal, no murmur, click, rub or gallop GI: soft, non-tender; bowel sounds normal; no masses,  no organomegaly Extremities: extremities normal, atraumatic, no cyanosis or edema Pulses: 2+ and symmetric Skin: Skin color, texture, turgor normal. No rashes or lesions Lymph nodes: Cervical, supraclavicular, and axillary nodes normal. Neurologic: Mental status: alertness: lethargic Motor: grossly normal He doesn't appear to have any problems moving his lower extremities. He is moving his upper extremities quite normally as well. He is confused.   Results for orders placed during the hospital encounter of 11/18/11 (from the past 48 hour(s))  CBC     Status: Abnormal   Collection Time   11/18/11  9:42 PM      Component Value Range Comment   WBC 10.0  4.0 - 10.5 (K/uL)    RBC 4.22  4.22 - 5.81 (MIL/uL)    Hemoglobin 12.9 (*) 13.0 - 17.0 (g/dL)    HCT 30.8  65.7 - 84.6 (%)    MCV 92.4  78.0 - 100.0 (fL)    MCH 30.6  26.0 - 34.0 (pg)    MCHC 33.1  30.0 - 36.0 (g/dL)    RDW 96.2  95.2 - 84.1 (%)    Platelets 257  150 - 400 (K/uL)   DIFFERENTIAL     Status: Abnormal   Collection Time   11/18/11  9:42 PM      Component Value Range Comment   Neutrophils Relative 79 (*) 43 - 77 (%)    Neutro Abs 7.9 (*) 1.7 - 7.7 (K/uL)    Lymphocytes Relative 10 (*) 12 - 46 (%)    Lymphs Abs 1.0  0.7 - 4.0 (K/uL)    Monocytes Relative 10  3 - 12 (%)    Monocytes Absolute 1.0  0.1 - 1.0 (K/uL)    Eosinophils Relative 1  0 - 5 (%)    Eosinophils Absolute 0.1  0.0 - 0.7 (K/uL)     Basophils Relative 0  0 - 1 (%)    Basophils Absolute 0.0  0.0 - 0.1 (K/uL)   COMPREHENSIVE METABOLIC PANEL     Status: Abnormal   Collection Time   11/18/11  9:42 PM      Component Value Range Comment   Sodium 140  135 - 145 (mEq/L)    Potassium 4.0  3.5 - 5.1 (mEq/L)    Chloride 108  96 - 112 (mEq/L)    CO2  21  19 - 32 (mEq/L)    Glucose, Bld 149 (*) 70 - 99 (mg/dL)    BUN 52 (*) 6 - 23 (mg/dL)    Creatinine, Ser 4.09  0.50 - 1.35 (mg/dL)    Calcium 81.1 (*) 8.4 - 10.5 (mg/dL)    Total Protein 7.8  6.0 - 8.3 (g/dL)    Albumin 3.3 (*) 3.5 - 5.2 (g/dL)    AST 24  0 - 37 (U/L)    ALT 25  0 - 53 (U/L)    Alkaline Phosphatase 116  39 - 117 (U/L)    Total Bilirubin 0.8  0.3 - 1.2 (mg/dL)    GFR calc non Af Amer 48 (*) >90 (mL/min)    GFR calc Af Amer 55 (*) >90 (mL/min)   URINALYSIS, ROUTINE W REFLEX MICROSCOPIC     Status: Abnormal   Collection Time   11/18/11 10:02 PM      Component Value Range Comment   Color, Urine YELLOW  YELLOW     Appearance CLEAR  CLEAR     Specific Gravity, Urine >1.030 (*) 1.005 - 1.030     pH 5.5  5.0 - 8.0     Glucose, UA NEGATIVE  NEGATIVE (mg/dL)    Hgb urine dipstick NEGATIVE  NEGATIVE     Bilirubin Urine NEGATIVE  NEGATIVE     Ketones, ur TRACE (*) NEGATIVE (mg/dL)    Protein, ur NEGATIVE  NEGATIVE (mg/dL)    Urobilinogen, UA 0.2  0.0 - 1.0 (mg/dL)    Nitrite NEGATIVE  NEGATIVE     Leukocytes, UA NEGATIVE  NEGATIVE  MICROSCOPIC NOT DONE ON URINES WITH NEGATIVE PROTEIN, BLOOD, LEUKOCYTES, NITRITE, OR GLUCOSE <1000 mg/dL.   Dg Chest Port 1 View  11/18/2011  *RADIOLOGY REPORT*  Clinical Data: Back pain.  PORTABLE CHEST - 1 VIEW  Comparison: 11/09/2011  Findings: Chronic scarring in both lung bases.  No evidence of edema, infiltrate, consolidation, pleural effusion or pneumothorax. The heart size is stable.  No fractures visualized.  IMPRESSION: No acute findings.  Chronic scarring at both lung bases.  Original Report Authenticated By: Reola Calkins, M.D.   Of note, he has CAT scan of his head recently after one of his falls and that did not show any acute intracranial process.  Assessment/Plan  Principal Problem:  *Dehydration Active Problems:  FTT (failure to thrive)  Compression fracture  Dementia  BPH (benign prostatic hyperplasia)   #1 dehydration and failure to thrive: Patient will be given IV fluids. He'll be seen by physical therapy and occupational therapy. I think part of the reason for his presentation is pain control issues. Hopefully, we can achieve better pain control in the hospital. Patient may require higher level of care.  #2 compression fracture of the lumbar spine: This patient requires adequate pain control. I don't believe he is a candidate for kyphoplasty considering his poor functional capacity and his dementia.  #3 history of dementia: Monitor for behavioral changes.  CODE STATUS was discussed with the daughter, and the patient. Will be a DO NOT RESUSCITATE. This patient may also be a candidate for palliative care and I have discussed this with the daughter.  Daughter is requesting that he be sent to a different skilled nursing facility. Will involve Child psychotherapist.  Aspiration precautions will utilized.  Further management decisions will depend on results of further testing and patient's response to treatment.  Tijuan Dantes 11/19/2011, 12:43 AM

## 2011-11-19 NOTE — Progress Notes (Signed)
Physical Therapy Evaluation Patient Details Name: [name] MRN: [id number] DOB: 02-12-1917 Today's Date: 11/19/2011  Problem List:  Patient Active Problem List  Diagnoses  . BURSITIS, HIP  . FRACTURE, FEMUR, INTERTROCHANTERIC REGION, LEFT  . Dehydration  . FTT (failure to thrive)  . Compression fracture  . Dementia  . BPH (benign prostatic hyperplasia)    Past Medical History:  Past Medical History  Diagnosis Date  . Dementia   . Hypertension   . Arthritis   . Osteoporosis    Past Surgical History:  Past Surgical History  Procedure Date  . Bilateral hip replacements   . Right wrist surgery for arthritis     PT Assessment/Plan/Recommendation PT Assessment Clinical Impression Statement: pt now with recent lumbar compression fx, , all mobility severely limited...per daughter, he has been able to transfer to w/c and gained mobility with it...now, he is so affraid of anything causing him pain, that he is afraid to move...it is difficult for him to understand that he will have to grit through some discomfort in order to regain mobility.Marland KitchenMarland KitchenI am hoping that with time and persistence we will be able to return him to prior functional level PT Recommendation/Assessment: Patient will need skilled PT in the acute care venue PT Problem List: Decreased range of motion;Decreased activity tolerance;Decreased mobility;Decreased cognition;Decreased safety awareness;Decreased knowledge of precautions;Pain Barriers to Discharge: None PT Therapy Diagnosis : Acute pain PT Plan PT Frequency: Min 3X/week PT Treatment/Interventions: Functional mobility training;Therapeutic exercise;Patient/family education PT Recommendation Recommendations for Other Services: OT consult Follow Up Recommendations: Skilled nursing facility Equipment Recommended: Defer to next venue PT Goals  Acute Rehab PT Goals PT Goal Formulation: With patient/family Pt will Roll Supine to Right Side: with mod assist Pt  will Roll Supine to Left Side: with mod assist Pt will go Supine/Side to Sit: with max assist Pt will Transfer Bed to Chair/Chair to Bed: with max assist  PT Evaluation Precautions/Restrictions  Precautions Precautions: Fall Required Braces or Orthoses: No Restrictions Weight Bearing Restrictions: No Prior Functioning  Home Living Receives Help From: Personal care attendant Type of Home: Assisted living Home Layout: One level Home Access: Level entry Prior Function Level of Independence: Needs assistance with tranfers (pt is w/c dependent, assist with all ADLs) Driving: No Vocation: Retired Financial risk analyst Arousal/Alertness: Awake/alert Overall Cognitive Status: History of cognitive impairments History of Cognitive Impairment: Appears at baseline functioning Orientation Level:  (unable to assess) Sensation/Coordination Sensation Light Touch: Not tested Stereognosis: Not tested Hot/Cold: Not tested Proprioception: Not tested Extremity Assessment RUE Assessment RUE Assessment: Within Functional Limits LUE Assessment LUE Assessment: Within Functional Limits RLE Assessment RLE Assessment: Within Functional Limits LLE Assessment LLE Assessment: Within Functional Limits Mobility (including Balance) Bed Mobility Bed Mobility: No (pt refuses to work with me-may be afraid of causing pain) Transfers Transfers: No (refuses) Ambulation/Gait Ambulation/Gait: No Stairs: No Wheelchair Mobility Wheelchair Mobility: No  Balance Balance Assessed: No Exercise    End of Session PT - End of Session Activity Tolerance: Patient limited by pain Patient left: in bed;with call bell in reach;with family/visitor present General Behavior During Session: Lethargic Cognition: Impaired, at baseline  [name] L 11/19/2011, 11:30 AM

## 2011-11-19 NOTE — ED Notes (Signed)
Dr. Rito Ehrlich at bedside to complete admission assessment.

## 2011-11-19 NOTE — Progress Notes (Signed)
The patient is a 77 year old man with a past medical history significant for degenerative joint disease, dementia,, recent falls, and T12 and L1 compression fractures. He was admitted this morning by Dr. Rito Ehrlich on for back pain. The patient was briefly seen. His medical information, vitals, and laboratory results were reviewed. The physical therapist has seen him and her assessment is noted and appreciated. We'll continue to hydrate the patient as he has an elevated BUN and hypercalcemia. Will treat his pain with scheduled Tylenol and as needed opiates. He may need a higher level of care. We'll continue to monitor. Will check his free T4, TSH, and vitamin B12. We'll monitor his blood calcium level as well.

## 2011-11-20 ENCOUNTER — Encounter (HOSPITAL_COMMUNITY): Payer: Self-pay | Admitting: Internal Medicine

## 2011-11-20 DIAGNOSIS — F329 Major depressive disorder, single episode, unspecified: Secondary | ICD-10-CM | POA: Diagnosis present

## 2011-11-20 DIAGNOSIS — F32A Depression, unspecified: Secondary | ICD-10-CM

## 2011-11-20 DIAGNOSIS — I10 Essential (primary) hypertension: Secondary | ICD-10-CM | POA: Diagnosis present

## 2011-11-20 HISTORY — DX: Depression, unspecified: F32.A

## 2011-11-20 LAB — BASIC METABOLIC PANEL
CO2: 23 mEq/L (ref 19–32)
Chloride: 111 mEq/L (ref 96–112)
Creatinine, Ser: 1.02 mg/dL (ref 0.50–1.35)
Glucose, Bld: 109 mg/dL — ABNORMAL HIGH (ref 70–99)
Sodium: 141 mEq/L (ref 135–145)

## 2011-11-20 LAB — CBC
HCT: 35.3 % — ABNORMAL LOW (ref 39.0–52.0)
MCH: 31.6 pg (ref 26.0–34.0)
MCV: 93.6 fL (ref 78.0–100.0)
RBC: 3.77 MIL/uL — ABNORMAL LOW (ref 4.22–5.81)
WBC: 9.5 10*3/uL (ref 4.0–10.5)

## 2011-11-20 LAB — VITAMIN B12: Vitamin B-12: 344 pg/mL (ref 211–911)

## 2011-11-20 MED ORDER — POLYETHYLENE GLYCOL 3350 17 G PO PACK: 17.0000 g | PACK | Freq: Every day | ORAL | Status: AC

## 2011-11-20 MED ORDER — APAP 325 MG PO TABS: ORAL_TABLET | ORAL | Status: DC

## 2011-11-20 NOTE — Progress Notes (Signed)
Physical Therapy Treatment Patient Details Name: ISAID SALVIA MRN: [id number] DOB: 18-Nov-1917 Today's Date: 11/20/2011  TIME: [phone]/ TA  PT Assessment/Plan  PT - Assessment/Plan Comments on Treatment Session: Pt was relatively cooperative this morning;pt refused to roll<>sit;elevated bed to 70 and assisted supine<>sit;pt able to sit EOB unassisted;due to dementia pt unable to follow exercise command but able to stand and transfer to recliner with Max A and RW PT Goals  Acute Rehab PT Goals PT Goal: Supine/Side to Sit - Progress: Met PT Transfer Goal: Bed to Chair/Chair to Bed - Progress: Met  PT Treatment Precautions/Restrictions  Precautions Precautions: Fall Required Braces or Orthoses: No Restrictions Weight Bearing Restrictions: No Mobility (including Balance) Bed Mobility Bed Mobility: Yes Supine to Sit: 3: Mod assist Supine to Sit Details (indicate cue type and reason): HOB elevated 70 degrees;pt needing assistance with trunk Transfers Transfers: Yes Sit to Stand: 3: Mod assist Sit to Stand Details (indicate cue type and reason): RW: verbal cues for thoracic extension and anterior weight shifting Stand to Sit: 3: Mod assist Stand to Sit Details: controlling descent Stand Pivot Transfers: 2: Max assist Ambulation/Gait Ambulation/Gait: No Stairs: No Wheelchair Mobility Wheelchair Mobility: No    Exercise  Other Exercises Other Exercises: sit<>stand x3 Other Exercises: AAROM shoulder flexion x 10 End of Session PT - End of Session Equipment Utilized During Treatment: Gait belt Activity Tolerance: Patient tolerated treatment well Patient left: in chair;with call bell in reach;with bed alarm set;with family/visitor present Nurse Communication: Mobility status for transfers (nursing ok'd pt in gerichair;family to notify desk leaving) General Behavior During Session: [facility] for tasks performed Cognition: Impaired, at baseline  Yona Kosek  ATKINSO 11/20/2011, 11:20 AM

## 2011-11-20 NOTE — Progress Notes (Signed)
Pt d/c today by MD to SNF.  CSW presented bed offers and pt's daughter chooses [facility].  [facility] Medicare authorization received per facility.  [facility]ility [facility] to transport pt.  No other needs reported.  Karn Cassis

## 2011-11-20 NOTE — Discharge Summary (Signed)
Physician Discharge Summary  [name] MRN: [id number] DOB/AGE: 1917-02-04 77 y.o.  PCP: Kirk Ruths, MD   Admit date: 11/18/2011 Discharge date: 11/20/2011  Discharge Diagnoses:  1. Dehydration. 2. Acute renal failure secondary to dehydration. 3. Failure to thrive and deconditioning. 4. T12 and L1 compression fractures, which occurred prior to this hospitalization due to a fall. 5. History of bilateral hip arthroplasties. 6. Hypercalcemia, secondary to dehydration. 7. Hypertension. 8. Anemia. 9. Chronic dementia. 10. Depression. 11. BPH.  12. DO NOT RESUSCITATE status.    Current Discharge Medication List    START taking these medications   Details  polyethylene glycol (MIRALAX / GLYCOLAX) packet Take 17 g by mouth daily. Qty: 14 each      CONTINUE these medications which have CHANGED   Details  acetaminophen 325 MG tablet Two tablets 2 times daily Qty: 120 tablet, Refills: 2      CONTINUE these medications which have NOT CHANGED   Details  Calcium Citrate-Vitamin D (CITRACAL PETITES/VITAMIN D) 200-250 MG-UNIT TABS Take 2 tablets by mouth 2 (two) times daily.      citalopram (CELEXA) 10 MG tablet Take 10 mg by mouth daily.      Cranberry 425 MG CAPS Take 1 capsule by mouth daily.      docusate sodium (COLACE) 100 MG capsule Take 100 mg by mouth 2 (two) times daily.      ENSURE (ENSURE) Take 237 mLs by mouth 3 (three) times daily.      HYDROcodone-acetaminophen (VICODIN) 5-500 MG per tablet Take 1 tablet by mouth every 6 (six) hours as needed. For pain     levofloxacin (LEVAQUIN) 750 MG tablet Take 750 mg by mouth daily. Completed 11/16/2011     megestrol (MEGACE) 40 MG/ML suspension Take 200 mg by mouth daily.     meloxicam (MOBIC) 7.5 MG tablet Take 7.5 mg by mouth daily.      metoprolol tartrate (LOPRESSOR) 25 MG tablet Take 12.5 mg by mouth 2 (two) times daily.      Multiple Vitamin (MULTIVITAMIN) tablet Take 1 tablet by mouth daily.  CHEWABLE: [employer] Complete Multivitamin    pantoprazole (PROTONIX) 40 MG tablet Take 40 mg by mouth daily.      potassium chloride SA (K-DUR,KLOR-CON) 20 MEQ tablet Take 20 mEq by mouth daily.     Tamsulosin HCl (FLOMAX) 0.4 MG CAPS Take 0.4 mg by mouth daily.       STOP taking these medications     traMADol (ULTRAM) 50 MG tablet         Discharge Condition: Stable  Disposition: Long Term Care   Consults: None   Significant Diagnostic Studies: Dg Lumbar Spine Complete  11/03/2011  *RADIOLOGY REPORT*  Clinical Data: Status post fall.  Pain  LUMBAR SPINE - COMPLETE 4+ VIEW  Comparison: 11/27/2006  Findings: There is a first-degree anterolisthesis of L5 on S1.  A compression fracture is noted involving the L1 vertebra.  Loss of approximately 40% of the vertebral body height noted.  Bones appeared diffusely osteopenic.  Mild disc space narrowing and ventral endplate spurring is noted at T12-L1, L1-2 and L2-3.  IMPRESSION:  1.  L1 compression fracture.  Age indeterminate. Per CMS PQRS reporting requirements (PQRS Measure 24): Given the patient's age of greater than 50 and the fracture site (hip, distal radius, or spine), the patient should be tested for osteoporosis using DXA, and the appropriate treatment considered based on the DXA results. 2.  First-degree anterolisthesis of L5 on  S1.  Original Report Authenticated By: Rosealee Albee, M.D.   Ct Head Wo Contrast  11/03/2011  *RADIOLOGY REPORT*  Clinical Data:  Rolled out of bed.  Hit head.  History of dementia.  CT HEAD WITHOUT CONTRAST CT CERVICAL SPINE WITHOUT CONTRAST  Technique:  Multidetector CT imaging of the head and cervical spine was performed following the standard protocol without intravenous contrast.  Multiplanar CT image reconstructions of the cervical spine were also generated.  Comparison:  10/12/2011  CT HEAD  Findings: There is diffuse patchy low density throughout the subcortical and periventricular white matter  consistent with chronic small vessel ischemic change.  There is prominence of the sulci and ventricles consistent with brain atrophy.  There is no evidence for acute brain infarct, hemorrhage or mass.  Similar appearance of partial opacification of the maxillary sinuses the skull appears intact.  IMPRESSION:  1.  Small vessel ischemic disease and brain atrophy. 2.  No acute intracranial abnormalities.  CT CERVICAL SPINE  Findings: Normal alignment of the cervical spine.  The prevertebral soft tissue space appears within normal limits.  The bones are diffusely osteopenic and there is multilevel disc space narrowing and ventral spurring.  No fractures or subluxations identified.  Lung apices are clear.  IMPRESSION:  1.  No acute findings identified.  Original Report Authenticated By: Rosealee Albee, M.D.   Ct Cervical Spine Wo Contrast  11/03/2011  *RADIOLOGY REPORT*  Clinical Data:  Rolled out of bed.  Hit head.  History of dementia.  CT HEAD WITHOUT CONTRAST CT CERVICAL SPINE WITHOUT CONTRAST  Technique:  Multidetector CT imaging of the head and cervical spine was performed following the standard protocol without intravenous contrast.  Multiplanar CT image reconstructions of the cervical spine were also generated.  Comparison:  10/12/2011  CT HEAD  Findings: There is diffuse patchy low density throughout the subcortical and periventricular white matter consistent with chronic small vessel ischemic change.  There is prominence of the sulci and ventricles consistent with brain atrophy.  There is no evidence for acute brain infarct, hemorrhage or mass.  Similar appearance of partial opacification of the maxillary sinuses the skull appears intact.  IMPRESSION:  1.  Small vessel ischemic disease and brain atrophy. 2.  No acute intracranial abnormalities.  CT CERVICAL SPINE  Findings: Normal alignment of the cervical spine.  The prevertebral soft tissue space appears within normal limits.  The bones are diffusely  osteopenic and there is multilevel disc space narrowing and ventral spurring.  No fractures or subluxations identified.  Lung apices are clear.  IMPRESSION:  1.  No acute findings identified.  Original Report Authenticated By: Rosealee Albee, M.D.   Ct Lumbar Spine Wo Contrast  11/16/2011  *RADIOLOGY REPORT*  Clinical Data:  NUMEROUS RECENT FALLS.  PAIN.  HISTORY OF BILATERAL HIP SURGERY.  UNABLE TO WALK.  CT LUMBAR SPINE WITHOUT CONTRAST  Technique:  Multidetector CT imaging of the lumbar spine was performed without intravenous contrast administration.  Multiplanar CT image reconstructions were also generated.  Comparison:  Plain film lumbar spine of 11/03/2011.  Findings:  Spinal imaging from the bottom of T11 through the upper sacrum.  Soft tissue windows demonstrate vascular calcifications medial of the bilateral kidneys.  Suspect a left extrarenal pelvis with nonspecific surrounding peripelvic edema on image 47 of series 4. No retroperitoneal or paraspinous hemorrhage.  Bone windows demonstrate moderate osteopenia.  Mild T12 compression deformity without significant canal compromise. This is felt to be new or progressive since 11/03/2011 (  but better imaged today).  A moderate L1 compression deformity is also similar to on the prior exam.  Approximately 4 mm of canal encroachment on image 32 of series 6 sagittal.  No extension into the posterior elements.  Remainder of vertebral body height within normal limits for age.  Relatively mild spondylosis at other lumbar levels.  IMPRESSION: Moderate L1 compression deformity with 4 mm of ventral canal encroachment.  Similar to 11/03/2011.  Mild T12 compression deformity, felt to be new or progressive since 11/03/2011 plain film.  Osteopenia.  CT PELVIS WITHOUT CONTRAST  Technique:  Multidetector CT imaging of the pelvis was performed following the standard protocol without intravenous contrast.  Findings:  Soft tissue windows demonstrate beam hardening artifact  from right hip arthroplasty and left proximal femoral fixation. Probable prostatomegaly.  Bone windows demonstrate remote left proximal femoral fracture. Moderate osteopenia.  Right hip arthroplasty.  No acute hardware complication identified.  No acute fracture.  Remote right inferior pubic ramus fracture.  IMPRESSION: Osteopenia without acute osseous abnormality about the pelvis.  Degraded by beam hardening artifact from bilateral hip hardware.  Original Report Authenticated By: Consuello Bossier, M.D.   Ct Pelvis Wo Contrast  11/16/2011  *RADIOLOGY REPORT*  Clinical Data:  NUMEROUS RECENT FALLS.  PAIN.  HISTORY OF BILATERAL HIP SURGERY.  UNABLE TO WALK.  CT LUMBAR SPINE WITHOUT CONTRAST  Technique:  Multidetector CT imaging of the lumbar spine was performed without intravenous contrast administration.  Multiplanar CT image reconstructions were also generated.  Comparison:  Plain film lumbar spine of 11/03/2011.  Findings:  Spinal imaging from the bottom of T11 through the upper sacrum.  Soft tissue windows demonstrate vascular calcifications medial of the bilateral kidneys.  Suspect a left extrarenal pelvis with nonspecific surrounding peripelvic edema on image 47 of series 4. No retroperitoneal or paraspinous hemorrhage.  Bone windows demonstrate moderate osteopenia.  Mild T12 compression deformity without significant canal compromise. This is felt to be new or progressive since 11/03/2011 (but better imaged today).  A moderate L1 compression deformity is also similar to on the prior exam.  Approximately 4 mm of canal encroachment on image 32 of series 6 sagittal.  No extension into the posterior elements.  Remainder of vertebral body height within normal limits for age.  Relatively mild spondylosis at other lumbar levels.  IMPRESSION: Moderate L1 compression deformity with 4 mm of ventral canal encroachment.  Similar to 11/03/2011.  Mild T12 compression deformity, felt to be new or progressive since 11/03/2011  plain film.  Osteopenia.  CT PELVIS WITHOUT CONTRAST  Technique:  Multidetector CT imaging of the pelvis was performed following the standard protocol without intravenous contrast.  Findings:  Soft tissue windows demonstrate beam hardening artifact from right hip arthroplasty and left proximal femoral fixation. Probable prostatomegaly.  Bone windows demonstrate remote left proximal femoral fracture. Moderate osteopenia.  Right hip arthroplasty.  No acute hardware complication identified.  No acute fracture.  Remote right inferior pubic ramus fracture.  IMPRESSION: Osteopenia without acute osseous abnormality about the pelvis.  Degraded by beam hardening artifact from bilateral hip hardware.  Original Report Authenticated By: Consuello Bossier, M.D.   Dg Chest Port 1 View  11/18/2011  *RADIOLOGY REPORT*  Clinical Data: Back pain.  PORTABLE CHEST - 1 VIEW  Comparison: 11/09/2011  Findings: Chronic scarring in both lung bases.  No evidence of edema, infiltrate, consolidation, pleural effusion or pneumothorax. The heart size is stable.  No fractures visualized.  IMPRESSION: No acute findings.  Chronic scarring  at both lung bases.  Original Report Authenticated By: Reola Calkins, M.D.   Dg Chest Port 1 View  11/10/2011  *RADIOLOGY REPORT*  Clinical Data: Back pain status post fall, cough.  PORTABLE CHEST - 1 VIEW  Comparison: 04/08/2011  Findings: Hypoaeration results in interstitial crowding.  Mild linear right lung base opacity. Mild retrocardiac opacity.  No pleural effusion or pneumothorax.  Cardiomediastinal contours are unchanged allowing for differences in technique with mild tortuosity to the aorta.  No acute osseous abnormality identified however note that evaluation of the spine is limited by frontal portable technique.  IMPRESSION: Hypoaeration results in interstitial crowding.  Mild retrocardiac opacity may reflect atelectasis or infiltrate.  Original Report Authenticated By: Waneta Martins, M.D.     Microbiology: Recent Results (from the past 240 hour(s))  MRSA PCR SCREENING     Status: Abnormal   Collection Time   11/19/11  8:32 AM      Component Value Range Status Comment   MRSA by PCR INVALID RESULTS, SPECIMEN SENT FOR CULTURE (*) NEGATIVE  Final   MRSA PCR SCREENING     Status: Normal   Collection Time   11/19/11  6:01 PM      Component Value Range Status Comment   MRSA by PCR NEGATIVE  NEGATIVE  Final      Labs: Results for orders placed during the hospital encounter of 11/18/11 (from the past 48 hour(s))  CBC     Status: Abnormal   Collection Time   11/18/11  9:42 PM      Component Value Range Comment   WBC 10.0  4.0 - 10.5 (K/uL)    RBC 4.22  4.22 - 5.81 (MIL/uL)    Hemoglobin 12.9 (*) 13.0 - 17.0 (g/dL)    HCT 16.1  09.6 - 04.5 (%)    MCV 92.4  78.0 - 100.0 (fL)    MCH 30.6  26.0 - 34.0 (pg)    MCHC 33.1  30.0 - 36.0 (g/dL)    RDW 40.9  81.1 - 91.4 (%)    Platelets 257  150 - 400 (K/uL)   DIFFERENTIAL     Status: Abnormal   Collection Time   11/18/11  9:42 PM      Component Value Range Comment   Neutrophils Relative 79 (*) 43 - 77 (%)    Neutro Abs 7.9 (*) 1.7 - 7.7 (K/uL)    Lymphocytes Relative 10 (*) 12 - 46 (%)    Lymphs Abs 1.0  0.7 - 4.0 (K/uL)    Monocytes Relative 10  3 - 12 (%)    Monocytes Absolute 1.0  0.1 - 1.0 (K/uL)    Eosinophils Relative 1  0 - 5 (%)    Eosinophils Absolute 0.1  0.0 - 0.7 (K/uL)    Basophils Relative 0  0 - 1 (%)    Basophils Absolute 0.0  0.0 - 0.1 (K/uL)   COMPREHENSIVE METABOLIC PANEL     Status: Abnormal   Collection Time   11/18/11  9:42 PM      Component Value Range Comment   Sodium 140  135 - 145 (mEq/L)    Potassium 4.0  3.5 - 5.1 (mEq/L)    Chloride 108  96 - 112 (mEq/L)    CO2 21  19 - 32 (mEq/L)    Glucose, Bld 149 (*) 70 - 99 (mg/dL)    BUN 52 (*) 6 - 23 (mg/dL)    Creatinine, Ser 7.82  0.50 - 1.35 (mg/dL)  Calcium 11.4 (*) 8.4 - 10.5 (mg/dL)    Total Protein 7.8  6.0 - 8.3 (g/dL)    Albumin 3.3  (*) 3.5 - 5.2 (g/dL)    AST 24  0 - 37 (U/L)    ALT 25  0 - 53 (U/L)    Alkaline Phosphatase 116  39 - 117 (U/L)    Total Bilirubin 0.8  0.3 - 1.2 (mg/dL)    GFR calc non Af Amer 48 (*) >90 (mL/min)    GFR calc Af Amer 55 (*) >90 (mL/min)   URINALYSIS, ROUTINE W REFLEX MICROSCOPIC     Status: Abnormal   Collection Time   11/18/11 10:02 PM      Component Value Range Comment   Color, Urine YELLOW  YELLOW     APPearance CLEAR  CLEAR     Specific Gravity, Urine >1.030 (*) 1.005 - 1.030     pH 5.5  5.0 - 8.0     Glucose, UA NEGATIVE  NEGATIVE (mg/dL)    Hgb urine dipstick NEGATIVE  NEGATIVE     Bilirubin Urine NEGATIVE  NEGATIVE     Ketones, ur TRACE (*) NEGATIVE (mg/dL)    Protein, ur NEGATIVE  NEGATIVE (mg/dL)    Urobilinogen, UA 0.2  0.0 - 1.0 (mg/dL)    Nitrite NEGATIVE  NEGATIVE     Leukocytes, UA NEGATIVE  NEGATIVE  MICROSCOPIC NOT DONE ON URINES WITH NEGATIVE PROTEIN, BLOOD, LEUKOCYTES, NITRITE, OR GLUCOSE <1000 mg/dL.  COMPREHENSIVE METABOLIC PANEL     Status: Abnormal   Collection Time   11/19/11  5:33 AM      Component Value Range Comment   Sodium 144  135 - 145 (mEq/L)    Potassium 4.0  3.5 - 5.1 (mEq/L)    Chloride 112  96 - 112 (mEq/L)    CO2 24  19 - 32 (mEq/L)    Glucose, Bld 118 (*) 70 - 99 (mg/dL)    BUN 50 (*) 6 - 23 (mg/dL)    Creatinine, Ser 4.09  0.50 - 1.35 (mg/dL)    Calcium 81.1 (*) 8.4 - 10.5 (mg/dL)    Total Protein 7.2  6.0 - 8.3 (g/dL)    Albumin 3.0 (*) 3.5 - 5.2 (g/dL)    AST 19  0 - 37 (U/L)    ALT 21  0 - 53 (U/L)    Alkaline Phosphatase 107  39 - 117 (U/L)    Total Bilirubin 0.8  0.3 - 1.2 (mg/dL)    GFR calc non Af Amer 55 (*) >90 (mL/min)    GFR calc Af Amer 64 (*) >90 (mL/min)   CBC     Status: Abnormal   Collection Time   11/19/11  5:33 AM      Component Value Range Comment   WBC 8.6  4.0 - 10.5 (K/uL)    RBC 4.01 (*) 4.22 - 5.81 (MIL/uL)    Hemoglobin 12.4 (*) 13.0 - 17.0 (g/dL)    HCT 91.4 (*) 78.2 - 52.0 (%)    MCV 93.3  78.0 -  100.0 (fL)    MCH 30.9  26.0 - 34.0 (pg)    MCHC 33.2  30.0 - 36.0 (g/dL)    RDW 95.6  21.3 - 08.6 (%)    Platelets 244  150 - 400 (K/uL)   MRSA PCR SCREENING     Status: Abnormal   Collection Time   11/19/11  8:32 AM      Component Value Range Comment   MRSA by  PCR INVALID RESULTS, SPECIMEN SENT FOR CULTURE (*) NEGATIVE    TSH     Status: Normal   Collection Time   11/19/11 11:50 AM      Component Value Range Comment   TSH 2.595  0.350 - 4.500 (uIU/mL)   VITAMIN B12     Status: Normal   Collection Time   11/19/11 11:50 AM      Component Value Range Comment   Vitamin B-12 344  211 - 911 (pg/mL)   MRSA PCR SCREENING     Status: Normal   Collection Time   11/19/11  6:01 PM      Component Value Range Comment   MRSA by PCR NEGATIVE  NEGATIVE    BASIC METABOLIC PANEL     Status: Abnormal   Collection Time   11/20/11  5:32 AM      Component Value Range Comment   Sodium 141  135 - 145 (mEq/L)    Potassium 3.7  3.5 - 5.1 (mEq/L)    Chloride 111  96 - 112 (mEq/L)    CO2 23  19 - 32 (mEq/L)    Glucose, Bld 109 (*) 70 - 99 (mg/dL)    BUN 34 (*) 6 - 23 (mg/dL)    Creatinine, Ser 1.61  0.50 - 1.35 (mg/dL)    Calcium 09.6  8.4 - 10.5 (mg/dL)    GFR calc non Af Amer 62 (*) >90 (mL/min)    GFR calc Af Amer 71 (*) >90 (mL/min)   CBC     Status: Abnormal   Collection Time   11/20/11  5:32 AM      Component Value Range Comment   WBC 9.5  4.0 - 10.5 (K/uL)    RBC 3.77 (*) 4.22 - 5.81 (MIL/uL)    Hemoglobin 11.9 (*) 13.0 - 17.0 (g/dL)    HCT 04.5 (*) 40.9 - 52.0 (%)    MCV 93.6  78.0 - 100.0 (fL)    MCH 31.6  26.0 - 34.0 (pg)    MCHC 33.7  30.0 - 36.0 (g/dL)    RDW 81.1  91.4 - 78.2 (%)    Platelets 235  150 - 400 (K/uL)      HPI : The patient is a 77 year old man who previously resided at the [facility] (assisted living facility), who presented to the emergency department on 11/19/2011 with generalized failure to thrive and low back pain. Approximately 3-4 weeks ago, he was  getting out of bed and fell. Subsequent x-rays and scans in the emergency department revealed compression fractures of his lumbar and thoracic spine. He was given pain medication, however, over the past few days prior to this hospitalization, he ate and drank very little. He had also been limited functionally and was more or less wheelchair-bound. In the emergency department, he was noted to be afebrile and hemodynamically stable. His lab data were significant for a BUN of 52, creatinine of 1.26, and calcium of 11.4. His chest x-ray revealed no acute findings but with chronic scarring at both lung bases. His urinalysis was not indicative of infection. He was admitted for further evaluation and management.  HOSPITAL COURSE: Based on the clinical findings and laboratory studies, the patient was dehydrated and had prerenal azotemia/acute renal insufficiency. He was, therefore, started on IV fluids. For his back pain, he was treated with as needed Tylenol and as needed hydrocodone. CODE STATUS was discussed and confirmed with his daughter. She confirmed the DO NOT RESUSCITATE status. Given his age, dementia,  and poor functional capacity, he did not appear to be a candidate for vertebral plasty or kyphoplasty. Additional studies were ordered. His vitamin B 12 level was within normal limits at 344. His TSH was within normal limits at 2.5 . His hemoglobin fell slightly from 12.9-11.9 following hydration. No further investigational studies were ordered to evaluate his anemia given his age and debility. His renal function improved. His BUN was 52 on admission and 34 at the time of discharge. His creatinine was 1.26 on admission and 1.02 at the time of discharge. His hypercalcemia resolved. His blood calcium was 11.4 on admission and 10.5 at the time of discharge.  Following hydration and supportive treatment, the patient's intake improved. The physical therapist evaluated him and recommended ongoing physical therapy in a  skilled environment. Therefore, a skilled nursing facility bed was acquired for ongoing long-term physical therapy. His daughter was in full agreement.  The patient remained hemodynamically stable and afebrile. He was discharged in improved and stable condition.    Discharge Exam: Blood pressure 137/84, pulse 75, temperature 98.1 F (36.7 C), temperature source Oral, resp. rate 18, height 5\' 7"  (1.702 m), weight 65.6 kg (144 lb 10 oz), SpO2 96.00%.  Lungs: Decreased breath sounds in the bases, otherwise clear. Heart: S1, S2, with a soft systolic murmur. Abdomen: Positive bowel sounds, soft, nontender, nondistended. Back: Mild tenderness of the lumbosacral spine/muscles. Extremities: No pedal edema. Neurologic: The patient is alert but confused. He moves all of his extremities spontaneously.   Discharge Orders    Future Orders Please Complete By Expires   Diet - low sodium heart healthy      Scheduling Instructions:   DYSPHAGIA 3 DIET WITH ASSISTANCE WITH ALL MEALS.   Increase activity slowly      Scheduling Instructions:   PHYSICAL THERAPY EVALUATION AND TREATMENT.   Walk with assistance      Discharge instructions      Comments:   ASSISTANCE NEEDED WITH ALL MEALS. FLUID INTAKE ENCOURAGED TO AVOID DEHYDRATION.       Discharge time 40 minutes.    Signed: Kaityln Kallstrom 11/20/2011, 12:08 PM

## 2011-11-20 NOTE — Progress Notes (Signed)
Pt discharged to [facility] in [place] today per Dr. Sherrie Mustache. Pt's IV site D/C'd and WNL. Pt's VS stable at this time. Attempted to call report two different times to [facility] without any success. Pt left floor via WC in stable condition accompanied by NT. Dagoberto Ligas, RN

## 2011-11-22 LAB — MRSA CULTURE

## 2012-09-18 IMAGING — CT CT L SPINE W/O CM
4 of 6 series · 16 of 33 positions shown, 18 images · non-contrast
Comparison: Plain film lumbar spine of 11/03/2011.

CLINICAL DATA: NUMEROUS RECENT FALLS.  PAIN.  HISTORY OF BILATERAL
HIP SURGERY.  UNABLE TO WALK.

CT LUMBAR SPINE WITHOUT CONTRAST
TECHNIQUE: Multidetector CT imaging of the lumbar spine was
performed without intravenous contrast administration.  Multiplanar
CT image reconstructions were also generated.
TECHNIQUE: Multidetector CT imaging of the pelvis was performed
following the standard protocol without intravenous contrast.

[Series 4: lumbar spine 2.0 b30s · axial · 0.29mm/px · z∈[-142,+16]mm · 4 of 129 slices shown]
[im 26/129  bone]
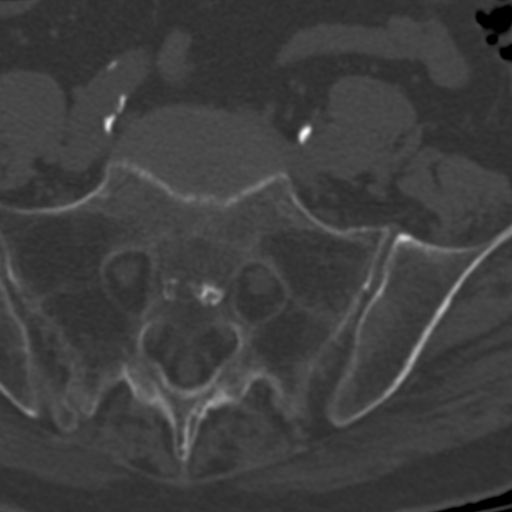
[im 52/129  bone]
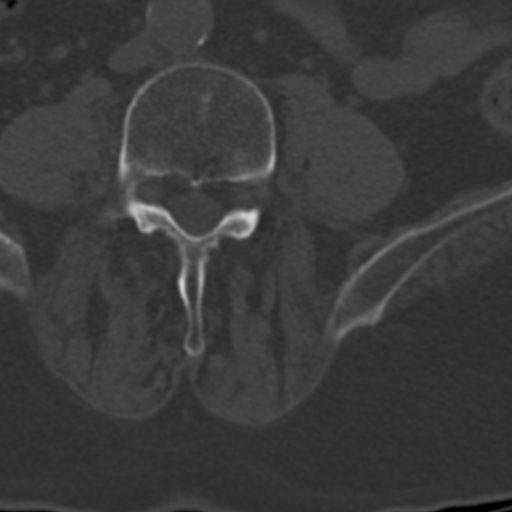
[im 77/129  bone]
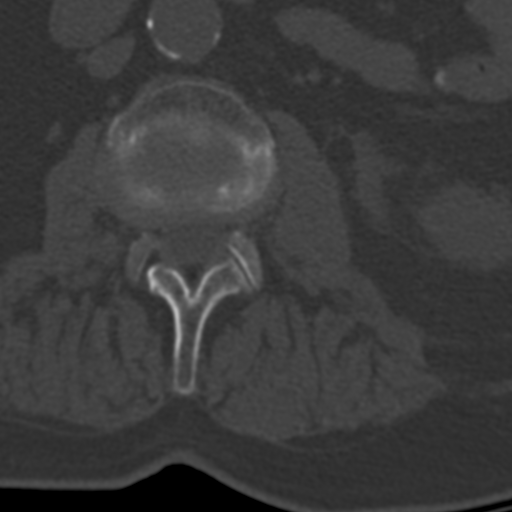
[im 103/129  bone]
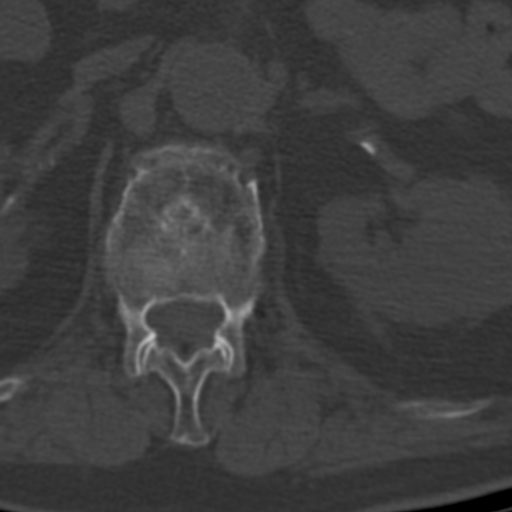

[Series 5: lumbar spine 2.0 spo cor · coronal · 0.31mm/px · 1 of 55 slices shown]
[im 28/55  bone]
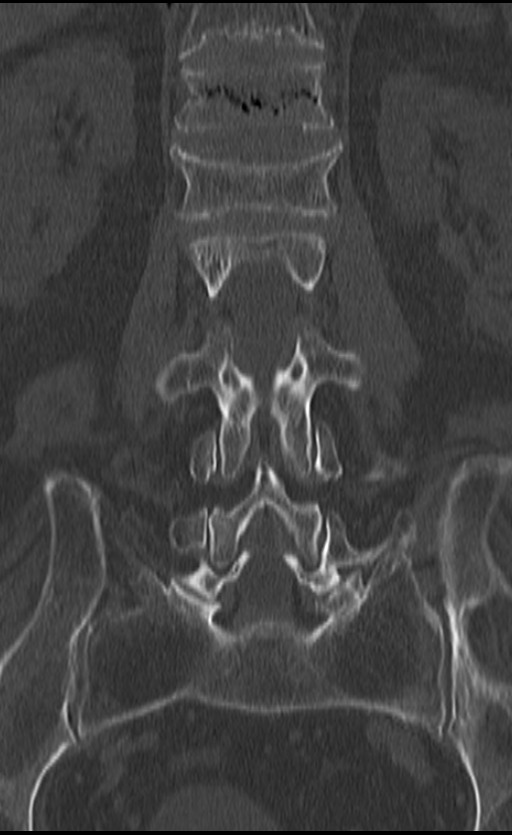

[Series 10: mpr pelvis sag bone 2.0 sag · sagittal · 0.63mm/px · 5 of 158 slices shown]
[im 40/158  bone]
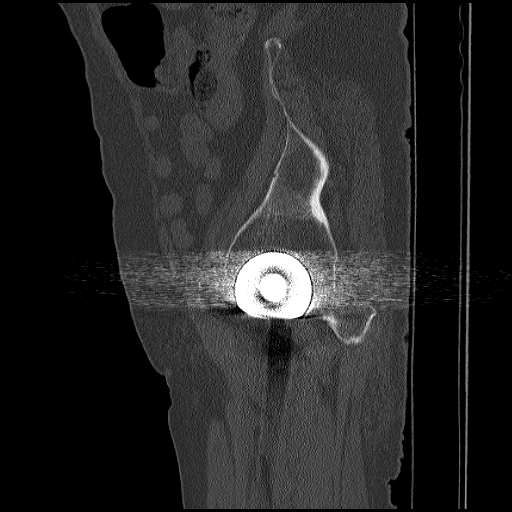
[im 59/158  bone]
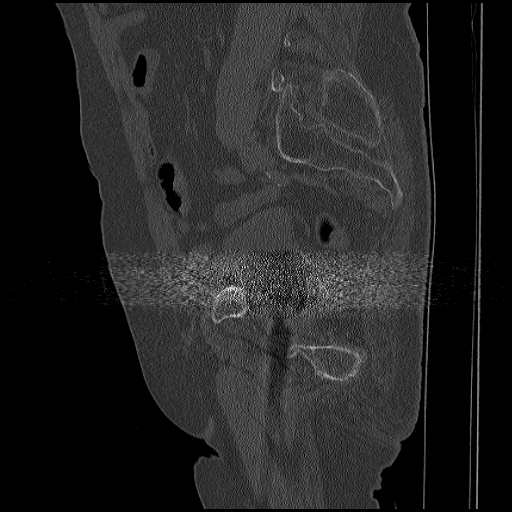
[im 79/158  bone]
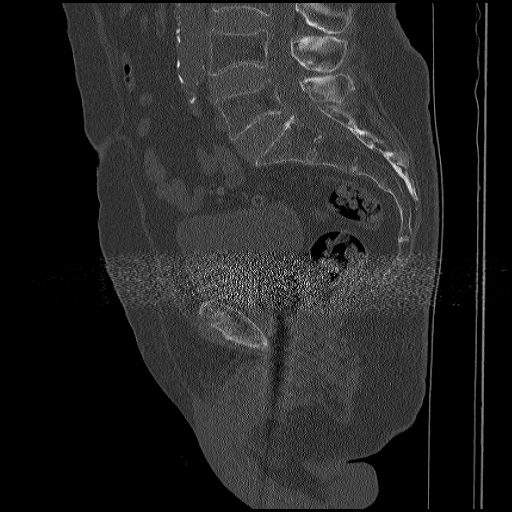
[im 99/158  bone]
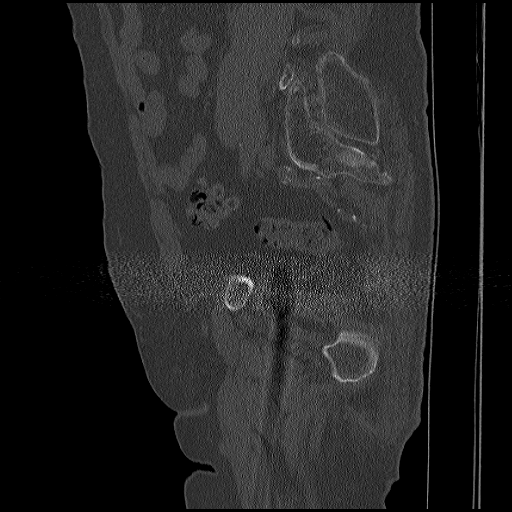
[im 118/158  bone]
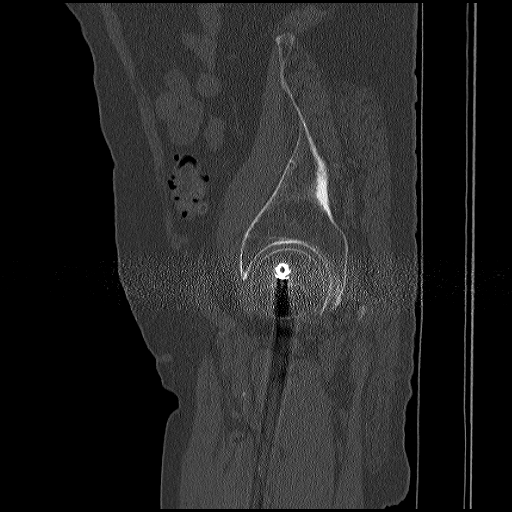

[Series 11: pelvis axial st 2.0 · axial · 0.67mm/px · z∈[-330,-100]mm · 6 of 161 slices shown, 8 images]
[im 23/161  soft-tissue]
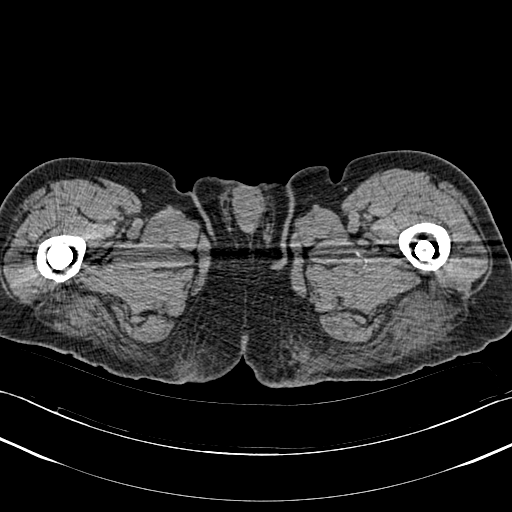
[im 23/161  bone]
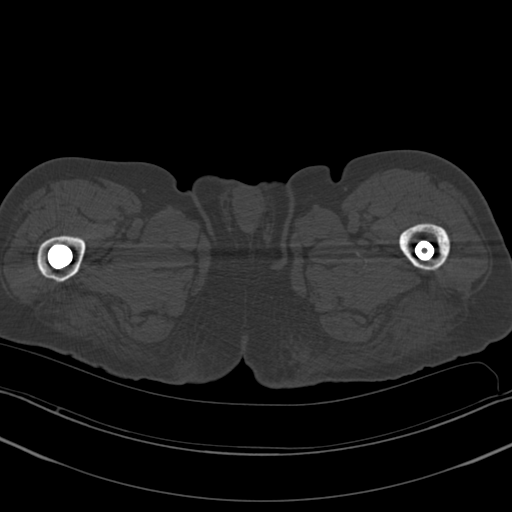
[im 46/161  bone]
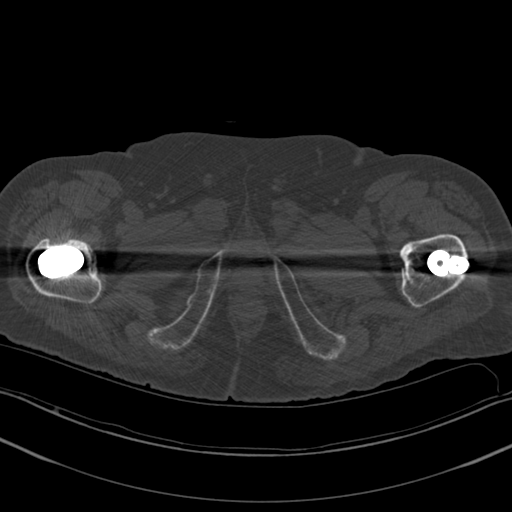
[im 69/161  bone]
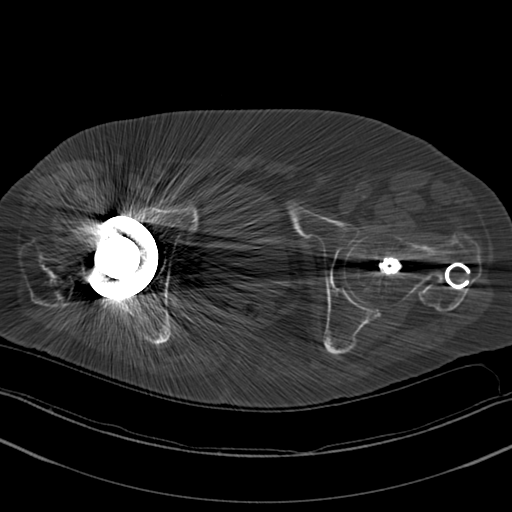
[im 92/161  bone]
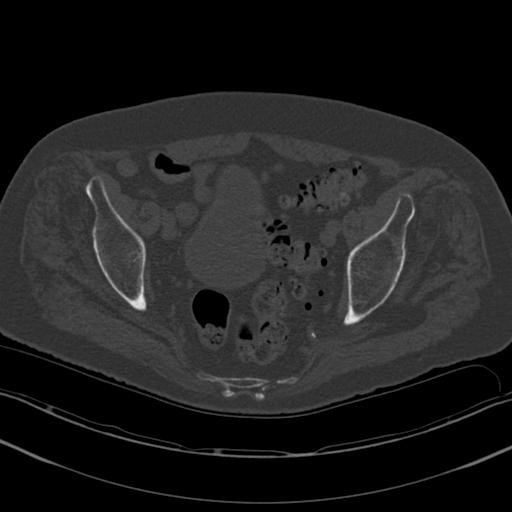
[im 115/161  soft-tissue]
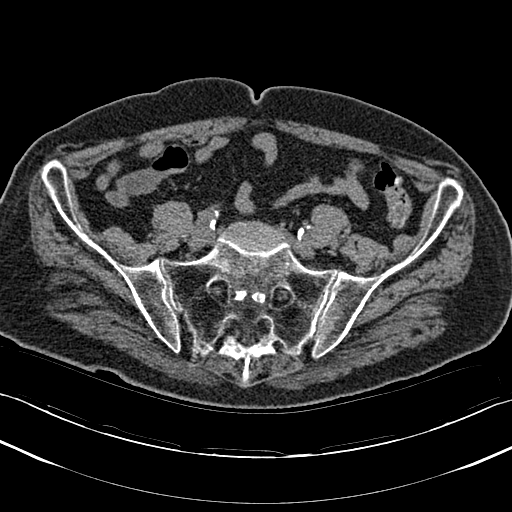
[im 115/161  bone]
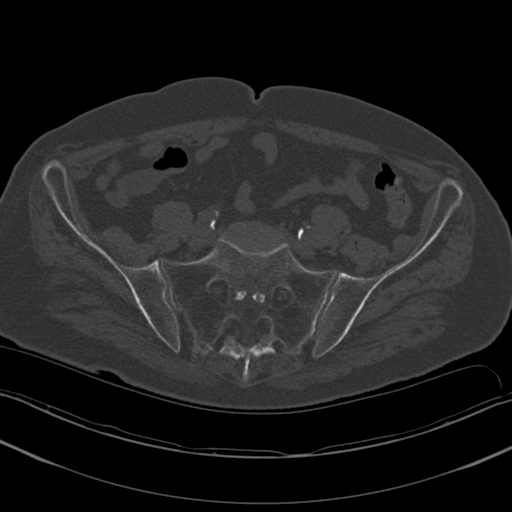
[im 138/161  bone]
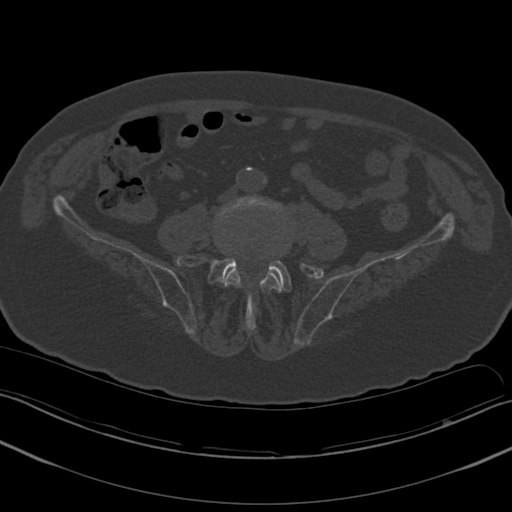

[16 of 33 positions shown; findings below may reference images not displayed]

FINDINGS: Spinal imaging from the bottom of T11 through the upper
sacrum.

Soft tissue windows demonstrate vascular calcifications medial of
the bilateral kidneys.  Suspect a left extrarenal pelvis with
nonspecific surrounding peripelvic edema on image 47 of series 4.
No retroperitoneal or paraspinous hemorrhage.

Bone windows demonstrate moderate osteopenia.

Mild T12 compression deformity without significant canal
compromise. This is felt to be new or progressive since 11/03/2011
(but better imaged today).

A moderate L1 compression deformity is also similar to on the prior
exam.  Approximately 4 mm of canal encroachment on image 32 of
series 6 sagittal.  No extension into the posterior elements.

Remainder of vertebral body height within normal limits for age.

Relatively mild spondylosis at other lumbar levels.
IMPRESSION: Moderate L1 compression deformity with 4 mm of ventral canal
encroachment.  Similar to 11/03/2011.

Mild T12 compression deformity, felt to be new or progressive since
11/03/2011 plain film.

Osteopenia.

CT PELVIS WITHOUT CONTRAST
FINDINGS: Soft tissue windows demonstrate beam hardening artifact
from right hip arthroplasty and left proximal femoral fixation.
Probable prostatomegaly.

Bone windows demonstrate remote left proximal femoral fracture.
Moderate osteopenia.  Right hip arthroplasty.  No acute hardware
complication identified.  No acute fracture.  Remote right inferior
pubic ramus fracture.
IMPRESSION: Osteopenia without acute osseous abnormality about the pelvis.

Degraded by beam hardening artifact from bilateral hip hardware.

## 2014-12-16 ENCOUNTER — Encounter (HOSPITAL_COMMUNITY): Payer: Self-pay

## 2014-12-16 ENCOUNTER — Observation Stay (HOSPITAL_COMMUNITY)
Admission: EM | Admit: 2014-12-16 | Discharge: 2014-12-18 | Disposition: A | Discharge status: Home / Self Care | Payer: Medicare Other | Attending: Family Medicine | Admitting: Family Medicine

## 2014-12-16 ENCOUNTER — Observation Stay (HOSPITAL_COMMUNITY): Payer: Medicare Other

## 2014-12-16 ENCOUNTER — Inpatient Hospital Stay (HOSPITAL_COMMUNITY): Payer: Medicare Other

## 2014-12-16 ENCOUNTER — Emergency Department (HOSPITAL_COMMUNITY): Payer: Medicare Other

## 2014-12-16 DIAGNOSIS — F329 Major depressive disorder, single episode, unspecified: Secondary | ICD-10-CM | POA: Diagnosis not present

## 2014-12-16 DIAGNOSIS — N400 Enlarged prostate without lower urinary tract symptoms: Secondary | ICD-10-CM | POA: Diagnosis not present

## 2014-12-16 DIAGNOSIS — M810 Age-related osteoporosis without current pathological fracture: Secondary | ICD-10-CM | POA: Diagnosis not present

## 2014-12-16 DIAGNOSIS — R627 Adult failure to thrive: Secondary | ICD-10-CM | POA: Diagnosis present

## 2014-12-16 DIAGNOSIS — Z87891 Personal history of nicotine dependence: Secondary | ICD-10-CM | POA: Insufficient documentation

## 2014-12-16 DIAGNOSIS — F0390 Unspecified dementia without behavioral disturbance: Secondary | ICD-10-CM | POA: Diagnosis not present

## 2014-12-16 DIAGNOSIS — M1990 Unspecified osteoarthritis, unspecified site: Secondary | ICD-10-CM | POA: Insufficient documentation

## 2014-12-16 DIAGNOSIS — G9340 Encephalopathy, unspecified: Principal | ICD-10-CM | POA: Insufficient documentation

## 2014-12-16 DIAGNOSIS — Z87828 Personal history of other (healed) physical injury and trauma: Secondary | ICD-10-CM | POA: Insufficient documentation

## 2014-12-16 DIAGNOSIS — R109 Unspecified abdominal pain: Secondary | ICD-10-CM

## 2014-12-16 DIAGNOSIS — R319 Hematuria, unspecified: Secondary | ICD-10-CM | POA: Insufficient documentation

## 2014-12-16 DIAGNOSIS — R4182 Altered mental status, unspecified: Secondary | ICD-10-CM

## 2014-12-16 DIAGNOSIS — I10 Essential (primary) hypertension: Secondary | ICD-10-CM | POA: Diagnosis not present

## 2014-12-16 DIAGNOSIS — A419 Sepsis, unspecified organism: Secondary | ICD-10-CM | POA: Diagnosis present

## 2014-12-16 DIAGNOSIS — N179 Acute kidney failure, unspecified: Secondary | ICD-10-CM | POA: Diagnosis present

## 2014-12-16 DIAGNOSIS — Z79899 Other long term (current) drug therapy: Secondary | ICD-10-CM | POA: Insufficient documentation

## 2014-12-16 DIAGNOSIS — E870 Hyperosmolality and hypernatremia: Secondary | ICD-10-CM

## 2014-12-16 DIAGNOSIS — N390 Urinary tract infection, site not specified: Secondary | ICD-10-CM | POA: Diagnosis present

## 2014-12-16 LAB — COMPREHENSIVE METABOLIC PANEL
ALBUMIN: 3.7 g/dL (ref 3.5–5.2)
ALK PHOS: 120 U/L — AB (ref 39–117)
ALT: 40 U/L (ref 0–53)
ANION GAP: 10 (ref 5–15)
AST: 41 U/L — ABNORMAL HIGH (ref 0–37)
BUN: 48 mg/dL — AB (ref 6–23)
CO2: 22 mmol/L (ref 19–32)
Calcium: 12 mg/dL — ABNORMAL HIGH (ref 8.4–10.5)
Chloride: 119 mEq/L — ABNORMAL HIGH (ref 96–112)
Creatinine, Ser: 1.87 mg/dL — ABNORMAL HIGH (ref 0.50–1.35)
GFR calc Af Amer: 34 mL/min — ABNORMAL LOW (ref >90)
GFR calc non Af Amer: 29 mL/min — ABNORMAL LOW (ref >90)
Glucose, Bld: 114 mg/dL — ABNORMAL HIGH (ref 70–99)
POTASSIUM: 3.7 mmol/L (ref 3.5–5.1)
Sodium: 151 mmol/L — ABNORMAL HIGH (ref 135–145)
TOTAL PROTEIN: 8.2 g/dL (ref 6.0–8.3)
Total Bilirubin: 1.4 mg/dL — ABNORMAL HIGH (ref 0.3–1.2)

## 2014-12-16 LAB — CBC WITH DIFFERENTIAL/PLATELET
BASOS PCT: 0 % (ref 0–1)
Basophils Absolute: 0 10*3/uL (ref 0.0–0.1)
EOS ABS: 0.1 10*3/uL (ref 0.0–0.7)
Eosinophils Relative: 1 % (ref 0–5)
HCT: 44.2 % (ref 39.0–52.0)
HEMOGLOBIN: 14.5 g/dL (ref 13.0–17.0)
Lymphocytes Relative: 14 % (ref 12–46)
Lymphs Abs: 1.6 10*3/uL (ref 0.7–4.0)
MCH: 31.7 pg (ref 26.0–34.0)
MCHC: 32.8 g/dL (ref 30.0–36.0)
MCV: 96.5 fL (ref 78.0–100.0)
MONOS PCT: 9 % (ref 3–12)
Monocytes Absolute: 1.1 10*3/uL — ABNORMAL HIGH (ref 0.1–1.0)
NEUTROS PCT: 76 % (ref 43–77)
Neutro Abs: 9.2 10*3/uL — ABNORMAL HIGH (ref 1.7–7.7)
PLATELETS: 254 10*3/uL (ref 150–400)
RBC: 4.58 MIL/uL (ref 4.22–5.81)
RDW: 14.7 % (ref 11.5–15.5)
WBC: 12 10*3/uL — ABNORMAL HIGH (ref 4.0–10.5)

## 2014-12-16 LAB — URINE MICROSCOPIC-ADD ON

## 2014-12-16 LAB — URINALYSIS, ROUTINE W REFLEX MICROSCOPIC
Glucose, UA: NEGATIVE mg/dL
LEUKOCYTES UA: NEGATIVE
NITRITE: NEGATIVE
Protein, ur: NEGATIVE mg/dL
SPECIFIC GRAVITY, URINE: 1.025 (ref 1.005–1.030)
UROBILINOGEN UA: 1 mg/dL (ref 0.0–1.0)
pH: 5 (ref 5.0–8.0)

## 2014-12-16 LAB — TROPONIN I: TROPONIN I: 0.04 ng/mL — AB (ref <0.031)

## 2014-12-16 LAB — I-STAT CG4 LACTIC ACID, ED: LACTIC ACID, VENOUS: 1.74 mmol/L (ref 0.5–2.2)

## 2014-12-16 MED ORDER — HALOPERIDOL LACTATE 5 MG/ML IJ SOLN
1.0000 mg | Freq: Once | INTRAMUSCULAR | Status: AC
  Administered 2014-12-16: 1 mg via INTRAVENOUS
  Filled 2014-12-16: qty 1

## 2014-12-16 MED ORDER — SODIUM CHLORIDE 0.9 % IJ SOLN: 3.0000 mL | Freq: Two times a day (BID) | INTRAMUSCULAR | Status: DC

## 2014-12-16 MED ORDER — HALOPERIDOL LACTATE 5 MG/ML IJ SOLN
1.5000 mg | Freq: Once | INTRAMUSCULAR | Status: AC
  Administered 2014-12-16: 1.5 mg via INTRAMUSCULAR

## 2014-12-16 MED ORDER — CEFTRIAXONE SODIUM IN DEXTROSE 20 MG/ML IV SOLN
1.0000 g | INTRAVENOUS | Status: DC
  Filled 2014-12-16: qty 50

## 2014-12-16 MED ORDER — SODIUM CHLORIDE 0.9 % IV SOLN
Freq: Once | INTRAVENOUS | Status: AC
  Administered 2014-12-16: 19:00:00 via INTRAVENOUS

## 2014-12-16 MED ORDER — SODIUM CHLORIDE 0.9 % IV SOLN
INTRAVENOUS | Status: DC
  Administered 2014-12-16 – 2014-12-17 (×2): via INTRAVENOUS

## 2014-12-16 MED ORDER — HEPARIN SODIUM (PORCINE) 5000 UNIT/ML IJ SOLN
5000.0000 [IU] | Freq: Three times a day (TID) | INTRAMUSCULAR | Status: DC
  Administered 2014-12-16 – 2014-12-17 (×2): 5000 [IU] via SUBCUTANEOUS
  Filled 2014-12-16 (×2): qty 1

## 2014-12-16 MED ORDER — HALOPERIDOL LACTATE 5 MG/ML IJ SOLN
2.0000 mg | Freq: Once | INTRAMUSCULAR | Status: AC
  Administered 2014-12-17: 2 mg via INTRAVENOUS
  Filled 2014-12-16: qty 1

## 2014-12-16 MED ORDER — ASPIRIN 81 MG PO CHEW: 81.0000 mg | CHEWABLE_TABLET | Freq: Once | ORAL | Status: DC

## 2014-12-16 MED ORDER — DEXTROSE 5 % IV SOLN
1.0000 g | INTRAVENOUS | Status: DC
  Filled 2014-12-16: qty 10

## 2014-12-16 MED ORDER — HALOPERIDOL LACTATE 5 MG/ML IJ SOLN
1.0000 mg | Freq: Once | INTRAMUSCULAR | Status: DC
  Filled 2014-12-16: qty 1

## 2014-12-16 MED ORDER — DEXTROSE 5 % IV SOLN
1.0000 g | Freq: Once | INTRAVENOUS | Status: AC
  Administered 2014-12-16: 1 g via INTRAVENOUS
  Filled 2014-12-16: qty 10

## 2014-12-16 MED ORDER — SODIUM CHLORIDE 0.9 % IV SOLN: INTRAVENOUS | Status: DC

## 2014-12-16 NOTE — ED Notes (Signed)
Patient pulled IV out, Rancour informed, IM Haldol given, will reattempt IV assess once patient calm.

## 2014-12-16 NOTE — ED Provider Notes (Signed)
CSN: [id number]     Arrival date & time 12/16/14  1657 History  This chart was scribed for Joel OctaveStephen Rosalia Mcavoy, MD by [name], ED Scribe. This patient was seen in room APA05/APA05 and the patient's care was started at 5:09 PM.   Chief Complaint  Patient presents with  . Failure To Thrive   LEVEL 5 CAVEAT: CONFUSED; DOES NOT FOLLOW COMMANDS  HPI   HPI Comments: [name] is a 80 y.o. male with a history of dementia, hypertension, arthritis, osteoporosis, FTT, fractures, depression, BPH, bilateral hip replacements, who presents to the Emergency Department complaining of failure to thrive. Per daughter, patient has not been eating or drinking well for the past few weeks. She states that patient normally feeds well but has recently stopped feeding. Patient is disoriented upon exam and is not speaking in coherent sentences. Per daughter, patient's mental status has progressively worsened in the past few weeks. Per daughter, patient was complaining of trouble breathing and abdominal pain yesterday. Patient denies any recent changes to medication. Patient has not had any recent fevers. Patient has DNR order.  Past Medical History  Diagnosis Date  . Dementia   . Hypertension   . Arthritis   . Osteoporosis   . FTT (failure to thrive)   . Fractures   . Depression 11/20/2011  . BPH (benign prostatic hyperplasia)    Past Surgical History  Procedure Laterality Date  . Bilateral hip replacements    . Right wrist surgery for arthritis     History reviewed. No pertinent family history. History  Substance Use Topics  . Smoking status: Former Smoker    Types: Cigars    Quit date: 11/18/2001  . Smokeless tobacco: Not on file  . Alcohol Use: No   Review of Systems  Unable to perform ROS: Other   Level 5 Caveat: Confused; Does not follow commands   Allergies  Review of patient's allergies indicates no known allergies.  Home Medications   Prior to Admission medications    Medication Sig Start Date End Date Taking? Authorizing Provider  acetaminophen 325 MG tablet Two tablets 2 times daily Patient taking differently: Take 325 mg by mouth 2 (two) times daily as needed for pain. Two tablets 2 times daily 11/20/11  Yes Elliot Cousinenise Fisher, MD  Calcium Carbonate-Vitamin D (CALCARB 600/D) 600-400 MG-UNIT per tablet Take 1 tablet by mouth daily.   Yes Historical Provider, MD  clonazePAM (KLONOPIN) 0.25 MG disintegrating tablet Take 0.25 mg by mouth daily as needed (for anxiety).   Yes Historical Provider, MD  ENSURE (ENSURE) Take 237 mLs by mouth 3 (three) times daily.     Yes Historical Provider, MD  finasteride (PROSCAR) 5 MG tablet Take 5 mg by mouth daily.   Yes Historical Provider, MD  loperamide (IMODIUM A-D) 2 MG tablet Take 2 mg by mouth 4 (four) times daily as needed for diarrhea or loose stools (4 mg by mouth at 1st loose stool, then 2mg  as needed but not to exceed more than 4 tablets in 24 hours).   Yes Historical Provider, MD  mirtazapine (REMERON) 15 MG tablet Take 15 mg by mouth at bedtime.   Yes Historical Provider, MD  omeprazole (PRILOSEC) 20 MG capsule Take 20 mg by mouth daily.   Yes Historical Provider, MD  polyethylene glycol powder (GLYCOLAX/MIRALAX) powder Take 17 g by mouth daily. *One capful mixed in water/juice (4-8 ounces) and taken daily for bowel regime   Yes Historical Provider, MD  potassium chloride (K-DUR) 10  MEQ tablet Take 20 mEq by mouth daily.   Yes Historical Provider, MD  sertraline (ZOLOFT) 100 MG tablet Take 100 mg by mouth daily.   Yes Historical Provider, MD  Tamsulosin HCl (FLOMAX) 0.4 MG CAPS Take 0.4 mg by mouth daily.    Yes Historical Provider, MD  traMADol (ULTRAM) 50 MG tablet Take 50 mg by mouth every 12 (twelve) hours as needed.   Yes Historical Provider, MD   Triage Vitals: BP 112/82 mmHg  Temp(Src) 98.9 F (37.2 C) (Oral)  Resp 16  Physical Exam  Constitutional: He appears well-developed and well-nourished. No distress.   Intermittently combative  HENT:  Head: Normocephalic and atraumatic.  Mouth/Throat: Oropharynx is clear and moist. No oropharyngeal exudate.  Eyes: Conjunctivae and EOM are normal. Pupils are equal, round, and reactive to light.  Neck: Normal range of motion. Neck supple.  No meningismus.  Cardiovascular: Normal rate, regular rhythm, normal heart sounds and intact distal pulses.   No murmur heard. Pulmonary/Chest: Effort normal and breath sounds normal. No respiratory distress. He exhibits no tenderness.  Abdominal: Soft. There is no tenderness. There is no rebound and no guarding.  Musculoskeletal: Normal range of motion. He exhibits no edema or tenderness.  Neurological: He is alert.  Moving all extremities Does not follow commands  Skin: Skin is warm.  Psychiatric: He has a normal mood and affect. His behavior is normal.  Nursing note and vitals reviewed.  ED Course  Procedures (including critical care time)  DIAGNOSTIC STUDIES:  COORDINATION OF CARE: 5:19 PM- Discussed plans to order diagnostic CXR, CT of head, EKG, and lab work. Pt advised of plan for treatment and pt agrees.  Labs Review Labs Reviewed  CBC WITH DIFFERENTIAL - Abnormal; Notable for the following:    WBC 12.0 (*)    Neutro Abs 9.2 (*)    Monocytes Absolute 1.1 (*)    All other components within normal limits  COMPREHENSIVE METABOLIC PANEL - Abnormal; Notable for the following:    Sodium 151 (*)    Chloride 119 (*)    Glucose, Bld 114 (*)    BUN 48 (*)    Creatinine, Ser 1.87 (*)    Calcium 12.0 (*)    AST 41 (*)    Alkaline Phosphatase 120 (*)    Total Bilirubin 1.4 (*)    GFR calc non Af Amer 29 (*)    GFR calc Af Amer 34 (*)    All other components within normal limits  TROPONIN I - Abnormal; Notable for the following:    Troponin I 0.04 (*)    All other components within normal limits  URINALYSIS, ROUTINE W REFLEX MICROSCOPIC - Abnormal; Notable for the following:    Hgb urine dipstick  LARGE (*)    Bilirubin Urine MODERATE (*)    Ketones, ur TRACE (*)    All other components within normal limits  URINE MICROSCOPIC-ADD ON - Abnormal; Notable for the following:    Bacteria, UA FEW (*)    All other components within normal limits  URINE CULTURE  CULTURE, BLOOD (ROUTINE X 2)  CULTURE, BLOOD (ROUTINE X 2)  CBC  CREATININE, SERUM  COMPREHENSIVE METABOLIC PANEL  CBC WITH DIFFERENTIAL  TROPONIN I  TROPONIN I  SODIUM, URINE, RANDOM  PTH, INTACT AND CALCIUM  VITAMIN D 1,25 DIHYDROXY  VITAMIN D 25 HYDROXY  I-STAT CG4 LACTIC ACID, ED    Imaging Review Ct Abdomen Pelvis Wo Contrast  12/16/2014   CLINICAL DATA:  Altered mental status.  Patient confused and agitated.  EXAM: CT ABDOMEN AND PELVIS WITHOUT CONTRAST  TECHNIQUE: Multidetector CT imaging of the abdomen and pelvis was performed following the standard protocol without IV contrast.  COMPARISON:  Lumbar CT 11/16/2011  FINDINGS: Lower chest: There is mild bibasilar atelectasis. No pleural fluid or pneumothorax.  Hepatobiliary: Simple fluid attenuation 2.7 cm lesion in the right hepatic lobe. Similar low-density lesions in the left hepatic lobe. These lesions likely represent benign cysts. No biliary duct dilatation. Gallbladder is demonstrates several small stones. No biliary duct dilatation.  Pancreas: Pancreas is normal. No ductal dilatation. No pancreatic inflammation.  Spleen: Normal spleen.  Adrenals/urinary tract: Adrenal glands are normal. There is no obstruction of the left or right kidney. No ureterolithiasis or bladder calculi. There has simple fluid attenuation cyst extending from the right kidney.  Stomach/Bowel: The stomach, small bowel, appendix, cecum normal. The colon and rectosigmoid colon are normal. There are multiple diverticula of the sigmoid colon. There is a large stool ball in the rectum measuring 7.5 cm.  Vascular/Lymphatic: Abdominal aorta is normal caliber. There is no retroperitoneal or periportal  lymphadenopathy. No pelvic lymphadenopathy.  Reproductive: Prostate gland is normal.  No pelvic lymphadenopathy.  Musculoskeletal: No aggressive osseous lesion. Right hip prosthetic and internal fixation of a left femur.  Other: No free fluid or abscess.  IMPRESSION: 1. Bibasilar atelectasis. 2. Multiple low-density lesions in the liver likely represent benign cysts. 3. Small gallbladder calculus. 4. Significant sigmoid diverticulosis without evidence of diverticulitis. 5. Large stool ball in the rectum.   Electronically Signed   By: Genevive Bi M.D.   On: 12/16/2014 23:47   Dg Chest 2 View  12/16/2014   CLINICAL DATA:  80 year old male with altered mental status and loss of appetite. Initial encounter.  EXAM: CHEST  2 VIEW  COMPARISON:  11/18/2011 and earlier.  FINDINGS: Portable AP semi upright view at 1758 hrs. Mildly larger lung volumes. Mildly increased attenuation of bronchovascular markings since 2010. Stable cardiac size and mediastinal contours. No pneumothorax or pulmonary edema. No definite pleural effusion or acute pulmonary opacity. Osteopenia. Widespread compression fractures in the spine.  IMPRESSION: No acute cardiopulmonary abnormality. Suspect progressed emphysema since 2010.   Electronically Signed   By: Augusto Gamble M.D.   On: 12/16/2014 18:22   Ct Head Wo Contrast  12/16/2014   CLINICAL DATA:  Altered mental status  EXAM: CT HEAD WITHOUT CONTRAST  TECHNIQUE: Contiguous axial images were obtained from the base of the skull through the vertex without intravenous contrast.  COMPARISON:  11/03/2011  FINDINGS: Bony calvarium is intact. No gross soft tissue abnormality is seen. The paranasal sinuses and mastoid air cells are well aerated. Atrophic in chronic white matter ischemic changes are again seen and stable. No findings to suggest acute hemorrhage, acute infarction or space-occupying mass lesion are noted.  IMPRESSION: Chronic atrophic and ischemic changes.  No acute abnormality noted.    Electronically Signed   By: Alcide Clever M.D.   On: 12/16/2014 18:32     EKG Interpretation   Date/Time:  Saturday December 16 2014 18:28:23 EST Ventricular Rate:  110 PR Interval:  154 QRS Duration: 88 QT Interval:  326 QTC Calculation: 441 R Axis:   72 Text Interpretation:  Sinus tachycardia Baseline wander in lead(s) II III  aVF V2 Nonspecific ST abnormality Artifact Confirmed by Manus Gunning  MD,  Nakita Santerre [id number]) on 12/16/2014 6:34:21 PM     MDM   Final diagnoses:  Altered mental state  Hematuria  Encephalopathy  Patient from nursing home with change in mental status over the past day, not eating, not drinking for the past several weeks. Confused more than baseline and intermittently combative.  Rectal temp 100.3. UA shows bacteria in hemoglobin. CT head negative. Chest x-ray negative.  Troponin 0.04. Sodium 151 with creatinine 1.8. EKG with possible ST depressions inferiorly but significant artifact.  Daughter at bedside interested in hospice and "just wants him to be comfortable."   She is agreeable to IVF and antibiotics and wouldn't want any other interventions.  Gentle IVF given, IV rocephin for possible UTI.  Admission d/w Dr. Alvester MorinNewton.   I personally performed the services described in this documentation, which was scribed in my presence. The recorded information has been reviewed and is accurate.  Joel OctaveStephen Muneer Leider, MD 12/17/14 [phone]

## 2014-12-16 NOTE — ED Notes (Signed)
Per EMS [facility] "not eating", "eating only a little bit" for the last "3 weeks. EMS states patient family otw

## 2014-12-16 NOTE — H&P (Signed)
Hospitalist Admission History and Physical  Patient name: [name] Medical record number: [id number] Date of birth: 07-24-1917 Age: 80 y.o. Gender: male  Primary Care Provider: Kirk RuthsMCGOUGH,WILLIAM M, MD  Chief Complaint: Encephalopathy, sepsis, AKI, dehydration, UTI, hypernatremia, mildly elevated trop   History of Present Illness:This is a 80 y.o. year old male with significant past medical history of end stage dementia, failure to thrive, HTN  presenting with encephalopathy, sepsis, AKI, dehydration, UTI, mildly elevated trop. Level V caveat as pt is confused and mildly agitated. Pt currently resident of local SNF. Family reports progressive decline over past 3-4 months with significant weight loss. Family feels pt has been progressively malnurished/dehydrated. Pt has had multiple failed attempts of IVF at SNF. Family saw patient yesterday and patient was noted to be acutely confused and mildly agitated. No reported fevers. Family does report patient having some intermittent subjective complaints of abdominal pain. No reports of vomiting or diarrhea. No chest pain or shortness of breath. No increased urination. Pres[facility] to [facility] temperature 100.3, heart rate in the 100s, respirations in the tens, blood pressure in the 100s to 110s, satting 95% on room air. White blood cell count 12.0, hemoglobin 14, creatinine 1.87, sodium 151, calcium 12, troponin 0.04. Head CT within normal limits. Chest x-ray within normal limits. Urinalysis mildly indicative of infection. Noted blood in urine. Patient is a DO NOT RESUSCITATE. Family is in process of getting patient placed into palliative care/hospice.  Assessment and Plan: [name] is a 80 y.o. year old male presenting with Encephalopathy, sepsis, AKI, dehydration, UTI, hypernatremia  Active Problems:   Encephalopathy   Sepsis   AKI (acute kidney injury)   UTI (lower urinary tract infection)   1- Encephalopathy  -multifactorial in  setting of end stage dementia, sepsis, metabolic derangement, dehydration among others  -hydrate pt  -treat UTI  -trend electrolytes  -follow   2- Sepsis/UTI -Suspect urinary source given UA findings  -IV rocephin  -blood and urine culture-Check CT abd and pelvis given report of abd pain  -nursing to assess for sacral ulcers (currently too agitated to assess)  3- AKI  -likely prerenal in etiology -hydrate and reasess -f/u CT abd and pelvis   4- Hypernatremia/Hypercalcemia -baseline dehydration  -check urine sodium -vit D, PTH, ionized ca  -hydrate -follow   5- Elevated trop  -milldy elevated -? Mild demand mismatch in setting of above  -no active CP  -EKG ? ST depressions in inferior leads-though w/ significant movement artifact  -ASA -trend trop  -family agreeable to medical management    FEN/GI: NPO  Prophylaxis: sub q heparin  Disposition: pending further evaluation  Code Status:DNR   Patient Active Problem List   Diagnosis Date Noted  . Encephalopathy 12/16/2014  . HTN (hypertension) 11/20/2011  . Depression 11/20/2011  . Dehydration 11/19/2011  . FTT (failure to thrive) 11/19/2011  . Compression fracture 11/19/2011  . Dementia 11/19/2011  . BPH (benign prostatic hyperplasia) 11/19/2011  . ARF (acute renal failure) 11/19/2011  . Hypercalcemia 11/19/2011  . Anemia 11/19/2011  . BURSITIS, HIP 07/17/2008  . FRACTURE, FEMUR, INTERTROCHANTERIC REGION, LEFT 05/10/2008   Past Medical History: Past Medical History  Diagnosis Date  . Dementia   . Hypertension   . Arthritis   . Osteoporosis   . FTT (failure to thrive)   . Fractures   . Depression 11/20/2011  . BPH (benign prostatic hyperplasia)     Past Surgical History: Past Surgical History  Procedure Laterality Date  .  Bilateral hip replacements    . Right wrist surgery for arthritis      Social History: History   Social History  . Marital Status: Widowed    Spouse Name: N/A    Number of  Children: N/A  . Years of Education: N/A   Social History Main Topics  . Smoking status: Former Smoker    Types: Cigars    Quit date: 11/18/2001  . Smokeless tobacco: None  . Alcohol Use: No  . Drug Use: No  . Sexual Activity: No   Other Topics Concern  . None   Social History Narrative    Family History: History reviewed. No pertinent family history.  Allergies: No Known Allergies  Current Facility-Administered Medications  Medication Dose Route Frequency Provider Last Rate Last Dose  . 0.9 %  sodium chloride infusion   Intravenous STAT Glynn Octave, MD      . 0.9 %  sodium chloride infusion   Intravenous Continuous Doree Albee, MD      . cefTRIAXone (ROCEPHIN) 1 g in dextrose 5 % 50 mL IVPB  1 g Intravenous Once Glynn Octave, MD 100 mL/hr at 12/16/14 1958 1 g at 12/16/14 1958  . cefTRIAXone (ROCEPHIN) 1 g in dextrose 5 % 50 mL IVPB  1 g Intravenous Q24H Doree Albee, MD      . haloperidol lactate (HALDOL) injection 1 mg  1 mg Intravenous Once Glynn Octave, MD      . heparin injection 5,000 Units  5,000 Units Subcutaneous 3 times per day Doree Albee, MD      . sodium chloride 0.9 % injection 3 mL  3 mL Intravenous Q12H Doree Albee, MD       Current Outpatient Prescriptions  Medication Sig Dispense Refill  . acetaminophen 325 MG tablet Two tablets 2 times daily (Patient taking differently: Take 325 mg by mouth 2 (two) times daily as needed for pain. Two tablets 2 times daily) 120 tablet 2  . Calcium Carbonate-Vitamin D (CALCARB 600/D) 600-400 MG-UNIT per tablet Take 1 tablet by mouth daily.    . clonazePAM (KLONOPIN) 0.25 MG disintegrating tablet Take 0.25 mg by mouth daily as needed (for anxiety).    . ENSURE (ENSURE) Take 237 mLs by mouth 3 (three) times daily.      . finasteride (PROSCAR) 5 MG tablet Take 5 mg by mouth daily.    Marland Kitchen loperamide (IMODIUM A-D) 2 MG tablet Take 2 mg by mouth 4 (four) times daily as needed for diarrhea or loose stools (4 mg by  mouth at 1st loose stool, then 2mg  as needed but not to exceed more than 4 tablets in 24 hours).    . mirtazapine (REMERON) 15 MG tablet Take 15 mg by mouth at bedtime.    Marland Kitchen omeprazole (PRILOSEC) 20 MG capsule Take 20 mg by mouth daily.    . polyethylene glycol powder (GLYCOLAX/MIRALAX) powder Take 17 g by mouth daily. *One capful mixed in water/juice (4-8 ounces) and taken daily for bowel regime    . potassium chloride (K-DUR) 10 MEQ tablet Take 20 mEq by mouth daily.    . sertraline (ZOLOFT) 100 MG tablet Take 100 mg by mouth daily.    . Tamsulosin HCl (FLOMAX) 0.4 MG CAPS Take 0.4 mg by mouth daily.     . traMADol (ULTRAM) 50 MG tablet Take 50 mg by mouth every 12 (twelve) hours as needed.     Review Of Systems: 12 point ROS negative except as noted above in HPI.  Physical Exam: Filed Vitals:   12/16/14 2011  BP: 106/75  Pulse: 103  Temp:   Resp: 24    General: confused, mildly agitated  HEENT: PERRLA, extra ocular movement intact and dry oral mucosa  Heart: S1, S2 normal, no murmur, rub or gallop, regular rate and rhythm Lungs: clear to auscultation, no wheezes or rales and unlabored breathing Abdomen: abdomen is soft without significant tenderness, masses, organomegaly or guarding Extremities: extremities normal, atraumatic, no cyanosis or edema Skin:no rashes Neurology: uncooperative to exam   Labs and Imaging: Lab Results  Component Value Date/Time   NA 151* 12/16/2014 05:32 PM   K 3.7 12/16/2014 05:32 PM   CL 119* 12/16/2014 05:32 PM   CO2 22 12/16/2014 05:32 PM   BUN 48* 12/16/2014 05:32 PM   CREATININE 1.87* 12/16/2014 05:32 PM   GLUCOSE 114* 12/16/2014 05:32 PM   Lab Results  Component Value Date   WBC 12.0* 12/16/2014   HGB 14.5 12/16/2014   HCT 44.2 12/16/2014   MCV 96.5 12/16/2014   PLT 254 12/16/2014   Urinalysis    Component Value Date/Time   COLORURINE YELLOW 12/16/2014 1829   APPEARANCEUR CLEAR 12/16/2014 1829   LABSPEC 1.025 12/16/2014 1829    PHURINE 5.0 12/16/2014 1829   GLUCOSEU NEGATIVE 12/16/2014 1829   HGBUR LARGE* 12/16/2014 1829   BILIRUBINUR MODERATE* 12/16/2014 1829   KETONESUR TRACE* 12/16/2014 1829   PROTEINUR NEGATIVE 12/16/2014 1829   UROBILINOGEN 1.0 12/16/2014 1829   NITRITE NEGATIVE 12/16/2014 1829   LEUKOCYTESUR NEGATIVE 12/16/2014 1829       Dg Chest 2 View  12/16/2014   CLINICAL DATA:  80 year old male with altered mental status and loss of appetite. Initial encounter.  EXAM: CHEST  2 VIEW  COMPARISON:  11/18/2011 and earlier.  FINDINGS: Portable AP semi upright view at 1758 hrs. Mildly larger lung volumes. Mildly increased attenuation of bronchovascular markings since 2010. Stable cardiac size and mediastinal contours. No pneumothorax or pulmonary edema. No definite pleural effusion or acute pulmonary opacity. Osteopenia. Widespread compression fractures in the spine.  IMPRESSION: No acute cardiopulmonary abnormality. Suspect progressed emphysema since 2010.   Electronically Signed   By: Augusto GambleLee  Joel M.D.   On: 12/16/2014 18:22   Ct Head Wo Contrast  12/16/2014   CLINICAL DATA:  Altered mental status  EXAM: CT HEAD WITHOUT CONTRAST  TECHNIQUE: Contiguous axial images were obtained from the base of the skull through the vertex without intravenous contrast.  COMPARISON:  11/03/2011  FINDINGS: Bony calvarium is intact. No gross soft tissue abnormality is seen. The paranasal sinuses and mastoid air cells are well aerated. Atrophic in chronic white matter ischemic changes are again seen and stable. No findings to suggest acute hemorrhage, acute infarction or space-occupying mass lesion are noted.  IMPRESSION: Chronic atrophic and ischemic changes.  No acute abnormality noted.   Electronically Signed   By: Alcide CleverMark  Lukens M.D.   On: 12/16/2014 18:32           Doree AlbeeSteven Inayah Woodin MD  Pager: [phone]

## 2014-12-16 NOTE — ED Notes (Signed)
22 gauge peripheral IV established to right FA, wrapped with Cobain.

## 2014-12-16 NOTE — ED Notes (Signed)
[facility] all questions [facility]. Will wait for patient to become more calm, and for IV assess to be regained before transfering patient.

## 2014-12-17 DIAGNOSIS — A419 Sepsis, unspecified organism: Secondary | ICD-10-CM

## 2014-12-17 DIAGNOSIS — E870 Hyperosmolality and hypernatremia: Secondary | ICD-10-CM

## 2014-12-17 DIAGNOSIS — N179 Acute kidney failure, unspecified: Secondary | ICD-10-CM

## 2014-12-17 DIAGNOSIS — G9340 Encephalopathy, unspecified: Secondary | ICD-10-CM | POA: Diagnosis not present

## 2014-12-17 DIAGNOSIS — F0391 Unspecified dementia with behavioral disturbance: Secondary | ICD-10-CM

## 2014-12-17 DIAGNOSIS — N390 Urinary tract infection, site not specified: Secondary | ICD-10-CM

## 2014-12-17 DIAGNOSIS — R627 Adult failure to thrive: Secondary | ICD-10-CM

## 2014-12-17 LAB — COMPREHENSIVE METABOLIC PANEL
ALT: 32 U/L (ref 0–53)
AST: 29 U/L (ref 0–37)
Albumin: 3.4 g/dL — ABNORMAL LOW (ref 3.5–5.2)
Alkaline Phosphatase: 108 U/L (ref 39–117)
Anion gap: 10 (ref 5–15)
BUN: 49 mg/dL — ABNORMAL HIGH (ref 6–23)
CALCIUM: 11.4 mg/dL — AB (ref 8.4–10.5)
CO2: 21 mmol/L (ref 19–32)
CREATININE: 1.77 mg/dL — AB (ref 0.50–1.35)
Chloride: 123 mEq/L — ABNORMAL HIGH (ref 96–112)
GFR calc Af Amer: 36 mL/min — ABNORMAL LOW (ref >90)
GFR, EST NON AFRICAN AMERICAN: 31 mL/min — AB (ref >90)
Glucose, Bld: 123 mg/dL — ABNORMAL HIGH (ref 70–99)
Potassium: 3.8 mmol/L (ref 3.5–5.1)
Sodium: 154 mmol/L — ABNORMAL HIGH (ref 135–145)
Total Bilirubin: 1.1 mg/dL (ref 0.3–1.2)
Total Protein: 7.6 g/dL (ref 6.0–8.3)

## 2014-12-17 LAB — CBC WITH DIFFERENTIAL/PLATELET
Basophils Absolute: 0 10*3/uL (ref 0.0–0.1)
Basophils Relative: 0 % (ref 0–1)
Eosinophils Absolute: 0.1 10*3/uL (ref 0.0–0.7)
Eosinophils Relative: 1 % (ref 0–5)
HCT: 42.9 % (ref 39.0–52.0)
Hemoglobin: 13.8 g/dL (ref 13.0–17.0)
LYMPHS PCT: 13 % (ref 12–46)
Lymphs Abs: 1.4 10*3/uL (ref 0.7–4.0)
MCH: 31.2 pg (ref 26.0–34.0)
MCHC: 32.2 g/dL (ref 30.0–36.0)
MCV: 97.1 fL (ref 78.0–100.0)
Monocytes Absolute: 0.8 10*3/uL (ref 0.1–1.0)
Monocytes Relative: 8 % (ref 3–12)
NEUTROS PCT: 78 % — AB (ref 43–77)
Neutro Abs: 8.2 10*3/uL — ABNORMAL HIGH (ref 1.7–7.7)
PLATELETS: 231 10*3/uL (ref 150–400)
RBC: 4.42 MIL/uL (ref 4.22–5.81)
RDW: 14.9 % (ref 11.5–15.5)
WBC: 10.4 10*3/uL (ref 4.0–10.5)

## 2014-12-17 LAB — SODIUM, URINE, RANDOM: SODIUM UR: 50 mmol/L

## 2014-12-17 LAB — TROPONIN I: TROPONIN I: 0.05 ng/mL — AB (ref <0.031)

## 2014-12-17 MED ORDER — MORPHINE SULFATE 2 MG/ML IJ SOLN
1.0000 mg | INTRAMUSCULAR | Status: DC | PRN
  Administered 2014-12-18 (×2): 1 mg via INTRAVENOUS
  Filled 2014-12-17 (×2): qty 1

## 2014-12-17 MED ORDER — LORAZEPAM 2 MG/ML IJ SOLN
0.5000 mg | Freq: Four times a day (QID) | INTRAMUSCULAR | Status: DC | PRN
  Administered 2014-12-17: 0.5 mg via INTRAVENOUS
  Filled 2014-12-17: qty 1

## 2014-12-17 MED ORDER — DEXTROSE 5 % IV SOLN
INTRAVENOUS | Status: DC
  Administered 2014-12-17: 17:00:00 via INTRAVENOUS

## 2014-12-17 MED ORDER — LORAZEPAM 2 MG/ML IJ SOLN
2.0000 mg | Freq: Once | INTRAMUSCULAR | Status: AC
  Administered 2014-12-17: 2 mg via INTRAVENOUS
  Filled 2014-12-17: qty 1

## 2014-12-17 NOTE — Progress Notes (Signed)
PROGRESS NOTE  [name] DOB: August 26, 1917 DOA: 12/16/2014 PCP: Kirk RuthsMCGOUGH,WILLIAM M, MD  Summary: 80 year old man with advanced dementia presented from long-term care with history of "not eating for several weeks" as well as progressive dementia. Rectal temp 100.3 in the emergency department. Daughter expressed interest in hospice but was agreeable to antibiotics and IV fluids. Was admitted for possible sepsis, possible UTI, acute kidney injury, electrolyte abnormalities and acute encephalopathy.  Assessment/Plan: 1. Hypernatremia, hypercalcemia secondary to dehydration, failure to thrive, secondary to advanced dementia. 2. Acute kidney injury secondary to failure to thrive secondary to dementia. 3. Acute encephalopathy with agitation, metabolic abnormalities, end-stage dementia. 4. Possible sepsis secondary to possible UTI. Urinalysis unimpressive. Urine culture pending. Did have low-grade fever on admission but no leukocytosis and no recurrent fever. Doubt sepsis/UTI. 5. Elevated troponin, history unobtainable. EKG with nonspecific ST changes, significant artifact, poor quality tracing.  6. Advanced dementia   80 year old man with weight loss, failure to thrive and minimal oral intake for the last 3 weeks secondary to end-stage dementia. Discussed in detail with daughter/HCPOA [name]. She requests hospice and would like [place] to go to hospice in [place]. She reports these been declining for some time now and has not eaten or drank in the last 3 weeks. She feels like he is suffering and does not want to prolong this. She does not drink fluids or antibiotics are in his best interest. She requests all curative attempts to be stopped, stop antibiotics, stop fluids. Focus on comfort care only. I concur.  Stop telemetry  Stop fluids, antibiotics  Comfort care only  Hopefully can transfer to hospice facility 12/28.  Code Status: DNR, comfort care DVT prophylaxis: not  indicated Family Communication:  Disposition Plan:  Brendia Sacksaniel Shruti Arrey, MD  [facility]  Pager [phone] If 7PM-7AM, please contact night-coverage at [email or web address], password [email or web address] 12/17/2014, 3:08 PM  LOS: 1 day   Consultants:    Procedures:    Antibiotics:  Ceftriaxone 12/26 >>  HPI/Subjective: Agitated and combative overnight. Per daughter Ativan worked better than Haldol.  Patient does not respond or provide history. Son-in-law at bedside.  Objective: Filed Vitals:   12/16/14 1830 12/16/14 2011 12/16/14 2130 12/17/14 0443  BP:  106/75 95/68 115/92  Pulse:  103 55 95  Temp: 100.3 F (37.9 C)  97.4 F (36.3 C) 97.5 F (36.4 C)  TempSrc: Rectal  Oral Axillary  Resp:  24 20 20   Height:   5\' 5"  (1.651 m)   Weight:   55.7 kg (122 lb 12.7 oz)   SpO2:  95% 88% 99%      Filed Weights   12/16/14 2130  Weight: 55.7 kg (122 lb 12.7 oz)    Exam:     Afebrile, vital signs stable.  General: Appears uncomfortable, ill but not  Psych: cannot assess  CV: Regular rhythm, tachycardic, no murmur, rub or gallop. No lower extremity edema. Telemetry sinus tachycardia. PVC. Artifact.  Respiratory: Clear to auscultation bilaterally. No wheezes, rales or rhonchi. Normal respiratory effort.  Abdomen: Soft, nondistended, no apparent tenderness  Skin: Grossly unremarkable  Neuro: Cannot assess.  Data Reviewed:  No urine output recorded, no intake recorded.  BUN and creatinine without significant change from 49/1.77.  Sodium worse, 154. Chloride worse 123. Anion gap normal. LFTs unremarkable.  Troponins minimally elevated, of unclear significance.  Leukocytosis has resolved, WBC 10.4.  Pertinent data: Admission  Sodium 151, chloride 119, BUN 48, creatinine 1.87, calcium 12.  Lactic acid was normal  Troponins slightly  elevated 0.04   Urinalysis equivocal, urine culture pending  CT abdomen and pelvis with a large stool ball in the rectum but no acute  abnormalities noted. CT head no acute abnormalities. Chest x-ray no acute abnormalities.  Pending data:  Urine culture  Blood cultures   Scheduled Meds: . sodium chloride   Intravenous STAT  . aspirin  81 mg Oral Once  . cefTRIAXone (ROCEPHIN)  IV  1 g Intravenous Q24H  . heparin  5,000 Units Subcutaneous 3 times per day  . sodium chloride  3 mL Intravenous Q12H   Continuous Infusions: . sodium chloride 100 mL/hr at 12/17/14 [id number]    Principal Problem:   Dementia Active Problems:   FTT (failure to thrive) in adult   Hypercalcemia   Encephalopathy   Sepsis   AKI (acute kidney injury)   UTI (lower urinary tract infection)   Hypernatremia   Time spent 35 minutes, greater than 50% in counseling and coordination of care.

## 2014-12-17 NOTE — Progress Notes (Addendum)
Patient's daughter visibly disturbed by patient's agitation. Patient she states she feels as if her father is in pain and it hurts her to seem him agitated.  Explained to daughter that because of age, dementia, and uti, patient would be agitated, and would probably not be at baseline for a few days.  Patient daughter states understanding, but states she feels as if father is suffering, and would like for him to be " knocked out".  Daughter understands that medication can slow breathing and heart rate, but states if medication would shorten his life, she would not be upset.  She states she would like to speak to hospice at some point. Lab came in to draw patient's troponin, but since patient was resting comfortably, daughter did not want patient disturbed.

## 2014-12-18 DIAGNOSIS — E870 Hyperosmolality and hypernatremia: Secondary | ICD-10-CM | POA: Diagnosis not present

## 2014-12-18 DIAGNOSIS — G9340 Encephalopathy, unspecified: Secondary | ICD-10-CM | POA: Diagnosis not present

## 2014-12-18 DIAGNOSIS — F0391 Unspecified dementia with behavioral disturbance: Secondary | ICD-10-CM | POA: Diagnosis not present

## 2014-12-18 DIAGNOSIS — N179 Acute kidney failure, unspecified: Secondary | ICD-10-CM | POA: Diagnosis not present

## 2014-12-18 LAB — URINE CULTURE
Colony Count: NO GROWTH
Culture: NO GROWTH

## 2014-12-18 MED ORDER — STROKE: EARLY STAGES OF RECOVERY BOOK
Freq: Once | Status: DC
  Filled 2014-12-18: qty 1

## 2014-12-18 MED ORDER — MORPHINE SULFATE (CONCENTRATE) 10 MG/0.5ML PO SOLN: 5.0000 mg | ORAL | Status: DC | PRN

## 2014-12-18 MED ORDER — MORPHINE SULFATE (CONCENTRATE) 10 MG/0.5ML PO SOLN: 5.0000 mg | ORAL | Status: AC | PRN

## 2014-12-18 MED ORDER — LORAZEPAM 0.5 MG PO TABS: 0.5000 mg | ORAL_TABLET | ORAL | Status: DC | PRN

## 2014-12-18 MED ORDER — LORAZEPAM 0.5 MG PO TABS: 0.5000 mg | ORAL_TABLET | ORAL | Status: AC | PRN

## 2014-12-18 NOTE — Progress Notes (Signed)
  PROGRESS NOTE  Hall BusingJoseph W Infantino ZOX:[id number] DOB: 1917/06/12 DOA: 12/16/2014 PCP: Kirk RuthsMCGOUGH,WILLIAM M, MD  Summary: 80 year old man with advanced dementia presented from long-term care with history of "not eating for several weeks" as well as progressive dementia. Rectal temp 100.3 in the emergency department. Daughter expressed interest in hospice but was agreeable to antibiotics and IV fluids. Was admitted for possible sepsis, possible UTI, acute kidney injury, electrolyte abnormalities and acute encephalopathy.  Assessment/Plan: 1. Hypernatremia, hypercalcemia secondary to dehydration, failure to thrive: secondary to advanced dementia. 2. Acute kidney injury secondary to failure to thrive secondary to dementia. 3. Acute encephalopathy with agitation, metabolic abnormalities, end-stage dementia. 4. Possible sepsis secondary to possible UTI. Urinalysis unimpressive. Urine culture pending. Low-grade fever but no leukocytosis. Doubt sepsis/UTI. 5. Elevated troponin, history unobtainable. EKG with nonspecific ST changes, significant artifact, poor quality tracing. No further evaluation. 6. End-stage dementia   80 year old man with weight loss, failure to thrive and minimal oral intake for the last 3 weeks secondary to end-stage dementia. He appears to be dying. Less expectancy less than 1 week. Plan transfer to inpatient hospice today per discussion with healthcare power of attorney/daughter at bedside. She is very pleased that he has been transitioned to comfort care.  Brendia Sacksaniel Caylen Yardley, MD  [facility]  Pager [phone] If 7PM-7AM, please contact night-coverage at [email or web address], password [email or web address] 12/18/2014, 11:14 AM  LOS: 2 days   Consultants:    Procedures:    Antibiotics:  Ceftriaxone 12/26 >>  HPI/Subjective: No issues charted overnight  Unable to offer history. Daughter at bedside. "I am so happy he is going to hospice."  Objective: Filed Vitals:   12/17/14 1529 12/17/14  1602 12/17/14 2156 12/18/14 0637  BP: 191/167 160/88 124/91 135/91  Pulse: 108  111 130  Temp: 99.1 F (37.3 C)  98.4 F (36.9 C) 100.4 F (38 C)  TempSrc: Oral  Axillary Oral  Resp: 20  20 20   Height:      Weight:      SpO2: 95%   95%      Filed Weights   12/16/14 2130  Weight: 55.7 kg (122 lb 12.7 oz)    Exam:     Tm 100.4, HR 130, SBP stable, stable hypoxia  Appears acutely ill. Tachycardic into.  Distant heart sounds.  Lungs generally clear.  Extremities appear unremarkable.  Abdomen soft.  Data Reviewed:  No new data  Pertinent data: Admission  Sodium 151, chloride 119, BUN 48, creatinine 1.87, calcium 12.  Lactic acid was normal  Troponins slightly elevated 0.04   Urinalysis equivocal, urine culture pending  CT abdomen and pelvis with a large stool ball in the rectum but no acute abnormalities noted. CT head no acute abnormalities. Chest x-ray no acute abnormalities.  Pending data:  Urine culture  Blood cultures   Scheduled Meds:   Continuous Infusions:    Principal Problem:   Dementia Active Problems:   FTT (failure to thrive) in adult   Hypercalcemia   Encephalopathy   Sepsis   AKI (acute kidney injury)   UTI (lower urinary tract infection)   Hypernatremia

## 2014-12-18 NOTE — Care Management Note (Unsigned)
    Page 1 of 1   12/18/2014     11:01:58 AM CARE MANAGEMENT NOTE 12/18/2014  Patient:  [name],[name]   Account Number:  [id number]  Date Initiated:  12/18/2014  Documentation initiated by:  Sharrie RothmanBLACKWELL,Kemaria Dedic C  Subjective/Objective Assessment:   [facility]. Pt is now full comfort care. [facility]     Action/Plan:   CSW is aware and will arrange discharge to facility when bed available. Can discharge today.   Anticipated DC Date:  12/18/2014   Anticipated DC Plan:  HOSPICE MEDICAL FACILITY  In-house referral  Clinical Social Worker      DC Planning Services  CM consult      [facility]   Choice offered to / List presented to:  C-4 Adult Children           Status of service:  Completed, signed off Medicare Important Message given?  NA - LOS <3 / Initial given by admissions (If response is "NO", the following Medicare IM given date fields will be blank) Date Medicare IM given:   Medicare IM given by:   Date Additional Medicare IM given:   Additional Medicare IM given by:    Discharge Disposition:  HOSPICE MEDICAL FACILITY  Per UR Regulation:    If discussed at Long Length of Stay Meetings, dates discussed:    Comments:  12/18/14 1100 Arlyss Queenammy Yaslyn Cumby, RN BSN CM

## 2014-12-18 NOTE — Clinical Social Work Psychosocial (Signed)
Clinical Social Work Department BRIEF PSYCHOSOCIAL ASSESSMENT 12/18/2014  Patient:  Hall BusingWOODS,Javoni W     Account Number:  [id number]     Admit date:  12/16/2014  Clinical Social Worker:  Robin SearingALDWELL,Lenisha Lacap, LCSWA  Date/Time:  12/18/2014 11:02 AM  Referred by:  Physician  Date Referred:  12/18/2014 Referred for  Residential hospice placement   Other Referral:   Interview type:  Other - See comment Other interview type:   Spoke with daughter at bedside    PSYCHOSOCIAL DATA Living Status:  FACILITY [facility] Level of care:  Skilled Nursing Facility Primary support name:  daughter- Stanton KidneyDebra Primary support relationship to patient:  FAMILY Degree of support available:   good    CURRENT CONCERNS Current Concerns  Post-Acute Placement   Other Concerns:    SOCIAL WORK ASSESSMENT / PLAN CSW spoke with daughter who states [facility] [place]. At this time, they are "ready" for end of life care- "I've taken good care of him".  Daughter has spoken with MD about hospice care and is requesting residential hospice at this time. I have sent clinicals to hospice andhave secured a bed for him at the residential hospice facility in [place]. Per daughter, her mother was also a patient at hospice setting.   Assessment/plan status:  No Further Intervention Required Other assessment/ plan:   Information/referral to community resources:   Hospice facility list    PATIENT'S/FAMILY'S RESPONSE TO PLAN OF CARE: Patient's family is very interested in [place] [facility] [facility] [place] and is pleased a bed is available for today. Daughter feels this is the best option to keep him comfortable here at the end of life.       Reece LevyJanet Memphis Decoteau, MSW, Theresia MajorsLCSWA [phone]

## 2014-12-18 NOTE — Discharge Summary (Signed)
Physician Discharge Summary  Joel BusingJoseph W [name] ZOX:[id number] DOB: [birth year] DOA: 12/16/2014  PCP: Joel RuthsMCGOUGH,WILLIAM M, MD  Admit date: 12/16/2014 Discharge date: 12/18/2014  Discharge Diagnoses:  1. Hypernatremia, hypercalcemia 2. Severe dehydration 3. Acute kidney injury 4. Failure to thrive 5. Acute encephalopathy 6. Elevated troponin 7. Possible sepsis secondary to possible UTI 8. End-stage dementia  Discharge Condition: terminal, life expectancy < 1 week Disposition: Residential hospice  Diet recommendation: as desired  Filed Weights   12/16/14 2130  Weight: 55.7 kg (122 lb 12.7 oz)    History of present illness:  80 year old man with advanced dementia presented from long-term care with history of "not eating for several weeks" as well as progressive dementia. Rectal temp 100.3 in the emergency department. Daughter expressed interest in hospice but was agreeable to antibiotics and IV fluids. Was admitted for possible sepsis, possible UTI, acute kidney injury, electrolyte abnormalities and acute encephalopathy.  Hospital Course:  [name] failed to improve with antibiotics and fluids in his clinical condition appeared to be declining. Daughter/healthcare power of attorney requested full comfort care, discontinuation of curative efforts and life-prolonging efforts. She requested transfer to hospice facility. Patient has end-stage dementia with ongoing failure to thrive, weight loss and minimal oral intake for 3 weeks prior to admission. He has severe electrolyte abnormalities, acute kidney injury, elevated troponin of unclear significance and possible sepsis although somewhat doubted. His acute illnesses are mostly referrable to his end-stage dementia and failure to thrive. He is an excellent hospice candidate.  1. Hypernatremia, hypercalcemia secondary to dehydration, failure to thrive: secondary to advanced dementia. 2. Acute kidney injury secondary to failure to thrive  secondary to dementia. 3. Acute encephalopathy with agitation, metabolic abnormalities, end-stage dementia. 4. Possible sepsis secondary to possible UTI. Urinalysis unimpressive. Urine culture pending. Low-grade fever but no leukocytosis. Doubt sepsis/UTI. 5. Elevated troponin, history unobtainable. EKG with nonspecific ST changes, significant artifact, poor quality tracing. No further evaluation. 6. End-stage dementia   80 year old man with weight loss, failure to thrive and minimal oral intake for the last 3 weeks secondary to end-stage dementia. He appears to be dying. Less expectancy less than 1 week. Plan transfer to inpatient hospice today per discussion with healthcare power of attorney/daughter at bedside. She is very pleased that he has been transitioned to comfort care.  Discharge Instructions  Discharge Instructions    Discharge instructions    Complete by:  As directed   Full comfort care.          Current Discharge Medication List    START taking these medications   Details  LORazepam (ATIVAN) 0.5 MG tablet Place 1 tablet (0.5 mg total) under the tongue every 2 (two) hours as needed for anxiety. Qty: 30 tablet, Refills: 0    Morphine Sulfate (MORPHINE CONCENTRATE) 10 MG/0.5ML SOLN concentrated solution Place 0.25 mLs (5 mg total) under the tongue every 2 (two) hours as needed for severe pain or shortness of breath. Qty: 30 mL, Refills: 0      STOP taking these medications     acetaminophen 325 MG tablet      Calcium Carbonate-Vitamin D (CALCARB 600/D) 600-400 MG-UNIT per tablet      clonazePAM (KLONOPIN) 0.25 MG disintegrating tablet      ENSURE (ENSURE)      finasteride (PROSCAR) 5 MG tablet      loperamide (IMODIUM A-D) 2 MG tablet      mirtazapine (REMERON) 15 MG tablet      omeprazole (PRILOSEC) 20 MG capsule  polyethylene glycol powder (GLYCOLAX/MIRALAX) powder      potassium chloride (K-DUR) 10 MEQ tablet      sertraline (ZOLOFT) 100 MG  tablet      Tamsulosin HCl (FLOMAX) 0.4 MG CAPS      traMADol (ULTRAM) 50 MG tablet        No Known Allergies  The results of significant diagnostics from this hospitalization (including imaging, microbiology, ancillary and laboratory) are listed below for reference.    Significant Diagnostic Studies: Ct Abdomen Pelvis Wo Contrast  12/16/2014   CLINICAL DATA:  Altered mental status. Patient confused and agitated.  EXAM: CT ABDOMEN AND PELVIS WITHOUT CONTRAST  TECHNIQUE: Multidetector CT imaging of the abdomen and pelvis was performed following the standard protocol without IV contrast.  COMPARISON:  Lumbar CT 11/16/2011  FINDINGS: Lower chest: There is mild bibasilar atelectasis. No pleural fluid or pneumothorax.  Hepatobiliary: Simple fluid attenuation 2.7 cm lesion in the right hepatic lobe. Similar low-density lesions in the left hepatic lobe. These lesions likely represent benign cysts. No biliary duct dilatation. Gallbladder is demonstrates several small stones. No biliary duct dilatation.  Pancreas: Pancreas is normal. No ductal dilatation. No pancreatic inflammation.  Spleen: Normal spleen.  Adrenals/urinary tract: Adrenal glands are normal. There is no obstruction of the left or right kidney. No ureterolithiasis or bladder calculi. There has simple fluid attenuation cyst extending from the right kidney.  Stomach/Bowel: The stomach, small bowel, appendix, cecum normal. The colon and rectosigmoid colon are normal. There are multiple diverticula of the sigmoid colon. There is a large stool ball in the rectum measuring 7.5 cm.  Vascular/Lymphatic: Abdominal aorta is normal caliber. There is no retroperitoneal or periportal lymphadenopathy. No pelvic lymphadenopathy.  Reproductive: Prostate gland is normal.  No pelvic lymphadenopathy.  Musculoskeletal: No aggressive osseous lesion. Right hip prosthetic and internal fixation of a left femur.  Other: No free fluid or abscess.  IMPRESSION: 1.  Bibasilar atelectasis. 2. Multiple low-density lesions in the liver likely represent benign cysts. 3. Small gallbladder calculus. 4. Significant sigmoid diverticulosis without evidence of diverticulitis. 5. Large stool ball in the rectum.   Electronically Signed   By: Genevive Bi [name].D.   On: 12/16/2014 23:47   Dg Chest 2 View  12/16/2014   CLINICAL DATA:  80 year old male with altered mental status and loss of appetite. Initial encounter.  EXAM: CHEST  2 VIEW  COMPARISON:  11/18/2011 and earlier.  FINDINGS: Portable AP semi upright view at 1758 hrs. Mildly larger lung volumes. Mildly increased attenuation of bronchovascular markings since 2010. Stable cardiac size and mediastinal contours. No pneumothorax or pulmonary edema. No definite pleural effusion or acute pulmonary opacity. Osteopenia. Widespread compression fractures in the spine.  IMPRESSION: No acute cardiopulmonary abnormality. Suspect progressed emphysema since 2010.   Electronically Signed   By: Augusto Gamble [name].D.   On: 12/16/2014 18:22   Ct Head Wo Contrast  12/16/2014   CLINICAL DATA:  Altered mental status  EXAM: CT HEAD WITHOUT CONTRAST  TECHNIQUE: Contiguous axial images were obtained from the base of the skull through the vertex without intravenous contrast.  COMPARISON:  11/03/2011  FINDINGS: Bony calvarium is intact. No gross soft tissue abnormality is seen. The paranasal sinuses and mastoid air cells are well aerated. Atrophic in chronic white matter ischemic changes are again seen and stable. No findings to suggest acute hemorrhage, acute infarction or space-occupying mass lesion are noted.  IMPRESSION: Chronic atrophic and ischemic changes.  No acute abnormality noted.   Electronically Signed  By: Alcide CleverMark  Lukens [name].D.   On: 12/16/2014 18:32    Microbiology: Recent Results (from the past 240 hour(s))  Culture, blood (routine x 2)     Status: None (Preliminary result)   Collection Time: 12/17/14  8:18 AM  Result Value Ref Range  Status   Specimen Description BLOOD RIGHT ARM  Final   Special Requests BOTTLES DRAWN AEROBIC AND ANAEROBIC 8CC BOTTLES  Final   Culture PENDING  Incomplete   Report Status PENDING  Incomplete  Culture, blood (routine x 2)     Status: None (Preliminary result)   Collection Time: 12/17/14  8:31 AM  Result Value Ref Range Status   Specimen Description BLOOD RIGHT ARM  Final   Special Requests BOTTLES DRAWN AEROBIC ONLY 6CC BOTTLE  Final   Culture PENDING  Incomplete   Report Status PENDING  Incomplete     Labs: Basic Metabolic Panel:  Recent Labs Lab 12/16/14 1732 12/17/14 0818  NA 151* 154*  K 3.7 3.8  CL 119* 123*  CO2 22 21  GLUCOSE 114* 123*  BUN 48* 49*  CREATININE 1.87* 1.77*  CALCIUM 12.0* 11.4*   Liver Function Tests:  Recent Labs Lab 12/16/14 1732 12/17/14 0818  AST 41* 29  ALT 40 32  ALKPHOS 120* 108  BILITOT 1.4* 1.1  PROT 8.2 7.6  ALBUMIN 3.7 3.4*   CBC:  Recent Labs Lab 12/16/14 1732 12/17/14 0818  WBC 12.0* 10.4  NEUTROABS 9.2* 8.2*  HGB 14.5 13.8  HCT 44.2 42.9  MCV 96.5 97.1  PLT 254 231   Cardiac Enzymes:  Recent Labs Lab 12/16/14 1732 12/17/14 0818  TROPONINI 0.04* 0.05*    Principal Problem:   Dementia Active Problems:   FTT (failure to thrive) in adult   Hypercalcemia   Encephalopathy   Sepsis   AKI (acute kidney injury)   UTI (lower urinary tract infection)   Hypernatremia   Time coordinating discharge: 35 minutes  Signed:  Brendia Sacksaniel Robi Mitter, MD [facility] 12/18/2014, 11:51 AM

## 2014-12-18 NOTE — Clinical Social Work Note (Signed)
[facility]. Family and patient agreeable to this plan- plan transfer via EMS. Reece LevyJanet Clark Clowdus, MSW, Theresia MajorsLCSWA [phone]

## 2014-12-18 NOTE — Progress Notes (Signed)
[facility] called [facility] Ot[facility] Bennet [facility]. Patient ready to be transported.

## 2014-12-18 NOTE — Progress Notes (Signed)
IV out. Dose Morphine given before removal for comfort with transport to Hospice home.

## 2014-12-19 LAB — PTH, INTACT AND CALCIUM
CALCIUM TOTAL (PTH): 11.7 mg/dL — AB (ref 8.4–10.5)
PTH: 142 pg/mL — ABNORMAL HIGH (ref 14–64)

## 2014-12-19 LAB — VITAMIN D 25 HYDROXY (VIT D DEFICIENCY, FRACTURES): VIT D 25 HYDROXY: 31 ng/mL (ref 30–100)

## 2014-12-22 DEATH — deceased

## 2014-12-23 LAB — CULTURE, BLOOD (ROUTINE X 2)
CULTURE: NO GROWTH
Culture: NO GROWTH

## 2014-12-24 LAB — VITAMIN D 1,25 DIHYDROXY
Vitamin D 1, 25 (OH)2 Total: 19 pg/mL (ref 18–72)
Vitamin D2 1, 25 (OH)2: <8 pg/mL
Vitamin D3 1, 25 (OH)2: 19 pg/mL
